# Patient Record
Sex: Male | Born: 1970 | Race: White | Hispanic: No | Marital: Single | Smoking: Current some day smoker
Health system: Southern US, Community
[De-identification: ages and names within clinical notes are randomized; demographics above are authoritative.]

## PROBLEM LIST (undated history)

## (undated) DIAGNOSIS — G43909 Migraine, unspecified, not intractable, without status migrainosus: Secondary | ICD-10-CM

## (undated) DIAGNOSIS — K219 Gastro-esophageal reflux disease without esophagitis: Secondary | ICD-10-CM

## (undated) DIAGNOSIS — G4730 Sleep apnea, unspecified: Secondary | ICD-10-CM

## (undated) DIAGNOSIS — F419 Anxiety disorder, unspecified: Secondary | ICD-10-CM

## (undated) DIAGNOSIS — E785 Hyperlipidemia, unspecified: Secondary | ICD-10-CM

## (undated) DIAGNOSIS — C8590 Non-Hodgkin lymphoma, unspecified, unspecified site: Secondary | ICD-10-CM

## (undated) DIAGNOSIS — M1990 Unspecified osteoarthritis, unspecified site: Secondary | ICD-10-CM

## (undated) DIAGNOSIS — F32A Depression, unspecified: Secondary | ICD-10-CM

## (undated) DIAGNOSIS — I10 Essential (primary) hypertension: Secondary | ICD-10-CM

## (undated) HISTORY — PX: COLONOSCOPY: SHX174

## (undated) HISTORY — DX: Anxiety disorder, unspecified: F41.9

## (undated) HISTORY — DX: Hyperlipidemia, unspecified: E78.5

## (undated) HISTORY — DX: Sleep apnea, unspecified: G47.30

## (undated) HISTORY — PX: UPPER GASTROINTESTINAL ENDOSCOPY: SHX188

---

## 2020-12-17 ENCOUNTER — Ambulatory Visit
Admission: RE | Admit: 2020-12-17 | Discharge: 2020-12-17 | Disposition: A | Discharge status: Home / Self Care | Payer: Self-pay | Source: Ambulatory Visit | Attending: Radiation Oncology | Admitting: Radiation Oncology

## 2020-12-17 ENCOUNTER — Other Ambulatory Visit: Payer: Self-pay | Admitting: Radiation Oncology

## 2020-12-17 DIAGNOSIS — C884 Extranodal marginal zone B-cell lymphoma of mucosa-associated lymphoid tissue [MALT-lymphoma]: Secondary | ICD-10-CM

## 2020-12-25 ENCOUNTER — Institutional Professional Consult (permissible substitution): Payer: Self-pay | Admitting: Radiation Oncology

## 2020-12-25 ENCOUNTER — Ambulatory Visit: Payer: Self-pay

## 2020-12-28 NOTE — Progress Notes (Signed)
Histology and Location of Primary Skin Cancer:  Recurrent primary cutaneous marginal zone lymphoma   Peri Maris presented with the following signs/symptoms: 2014 was found to have a slowly enlarging nodule on his left upper back that was excised and treated with radiation. In 2016 he experienced recurrence to his left upper chest wall and back, and left anterior arm, which were all excised and treated with radiation.   Biopsies revealed  11/13/2020    05/01/2015   03/20/2015   SAFETY ISSUES:  Prior radiation? Yes  July 2016  Oct. 2014   Pacemaker/ICD? No  Possible current pregnancy? N/A  Is the patient on methotrexate? No  Current Complaints / other details:  Patient has received both Pfizer vaccines, but not a COVID booster

## 2020-12-29 ENCOUNTER — Ambulatory Visit
Admission: RE | Admit: 2020-12-29 | Discharge: 2020-12-29 | Disposition: A | Discharge status: Home / Self Care | Payer: No Typology Code available for payment source | Source: Ambulatory Visit | Attending: Radiation Oncology | Admitting: Radiation Oncology

## 2020-12-29 ENCOUNTER — Other Ambulatory Visit: Payer: Self-pay

## 2020-12-29 ENCOUNTER — Encounter: Payer: Self-pay | Admitting: Radiation Oncology

## 2020-12-29 DIAGNOSIS — Z79899 Other long term (current) drug therapy: Secondary | ICD-10-CM | POA: Diagnosis not present

## 2020-12-29 DIAGNOSIS — Z923 Personal history of irradiation: Secondary | ICD-10-CM | POA: Diagnosis not present

## 2020-12-29 DIAGNOSIS — C884 Extranodal marginal zone B-cell lymphoma of mucosa-associated lymphoid tissue [MALT-lymphoma]: Secondary | ICD-10-CM

## 2020-12-29 DIAGNOSIS — Z87891 Personal history of nicotine dependence: Secondary | ICD-10-CM | POA: Diagnosis not present

## 2020-12-29 NOTE — Progress Notes (Signed)
Radiation Oncology         [phone] ________________________________  Initial Outpatient Consultation  Name: Romano Stigger MRN: [id number]  Date: 12/29/2020  DOB: [birth year]  CC:No primary care provider on file.  [place]  DIAGNOSIS:    ICD-10-CM   1. 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 male with a history of marginal zone lymphoma of multiple cutaneous sites.  He was serving in [employer] in [place] when he was originally diagnosed and treated in [place]. He has also received treatment in [place].  Recently he moved to [place].  His oncologist and primary doctor are through the [facility] in [place]  He presented in 2014 with a slowly enlarging nodule on his left upper back, which was excised and treated with radiation (marginal zone lymphoma).  PET scan on 07/25/2013 showed nonspecific nonenlarged mildly PET avid lymph nodes in the neck and right axilla.  Assessed to be reactive.  I personally reviewed those images.   The patient then experienced a recurrence in 2016 on his left upper chest wall, back, and left anterior arm, all of which were excised and treated with radiation. He is now experiencing an additional recurrence on his left forearm (different area than previous arm lesion).  Of note a bone marrow biopsy on 05/01/2015 was negative.  He denies any history of disease other than cutaneously.  Recent biopsy of an erythematous nodule on the left forearm was performed on 11/12/2020 and revealed marginal zone lymphoma.    PREVIOUS RADIATION THERAPY: Yes at outside medical centers  October 2014: Left upper back - 9 MeV; en face / 0.5 cm bolus; 200 cGy, 3,000 cGy  July 2016: Upper back, lower back,  upper chest, and left upper arm     PAST MEDICAL HISTORY:  has no past medical history on file.    PAST SURGICAL HISTORY:History reviewed. No pertinent surgical history.  FAMILY HISTORY: family history is not on file.  SOCIAL HISTORY:  reports that he quit smoking about 13 months ago. He has never used smokeless tobacco. He reports previous alcohol use. He reports that he does not use drugs.  ALLERGIES: Escitalopram  MEDICATIONS:  Current Outpatient Medications  Medication Sig Dispense Refill  . amLODipine (NORVASC) 10 MG tablet Take 1 tablet by mouth daily.    Marland Kitchen atorvastatin (LIPITOR) 10 MG tablet Take 10 mg by mouth daily.    . celecoxib (CELEBREX) 100 MG capsule Take 1 capsule by mouth daily.    . [employer]S GLUCOSAMINE-CHONDROITIN PO Take 1 capsule by mouth daily.    Marland Kitchen gabapentin (NEURONTIN) 300 MG capsule Take 1 capsule by mouth at bedtime.    . Melatonin 3 MG CAPS Take 2 tablets by mouth at bedtime as needed.    . Multiple Vitamin (MULTIVITAMIN ADULT PO) Take 1 tablet by mouth daily.    . Omega-3 Fatty Acids (FISH OIL) 1000 MG CAPS Take 1 capsule by mouth 2 (two) times daily.    Marland Kitchen omeprazole (PRILOSEC) 20 MG capsule Take 20 mg by mouth daily.    . SUMAtriptan (IMITREX) 50 MG tablet Take 50 mg by mouth daily as needed. No more than 256m per day    . testosterone cypionate (DEPOTESTOSTERONE CYPIONATE) 200 MG/ML injection Inject 200  mg into the muscle See admin instructions. Every 10 days     No current facility-administered medications for this encounter.    REVIEW OF SYSTEMS:  Notable for that above.   PHYSICAL EXAM:  height is 5' 10"  (1.778 m) and weight is 276 lb 12.8 oz (125.6 kg). His temperature is 97.9 F (36.6 C). His blood pressure is 113/71 and his pulse is 70. His respiration is 20 and oxygen saturation is 98%.   General: Alert and oriented, in no acute distress  Lymphatics: No palpable lymphadenopathy in the left antecubital or axillary or supraclavicular  regions Skin: There is a 1 to 2 cm area of subcutaneous nodularity over the left dorsal forearm at the area of his recent biopsy   ECOG 0  0 - Asymptomatic (Fully active, able to carry on all predisease activities without restriction)  1 - Symptomatic but completely ambulatory (Restricted in physically strenuous activity but ambulatory and able to carry out work of a light or sedentary nature. For example, light housework, office work)  2 - Symptomatic, <50% in bed during the day (Ambulatory and capable of all self care but unable to carry out any work activities. Up and about more than 50% of waking hours)  3 - Symptomatic, >50% in bed, but not bedbound (Capable of only limited self-care, confined to bed or chair 50% or more of waking hours)  4 - Bedbound (Completely disabled. Cannot carry on any self-care. Totally confined to bed or chair)  5 - Death   Eustace Pen MM, Creech RH, Tormey DC, et al. [id number]). "Toxicity and response criteria of the [facility]". [place] Oncol. 5 (6): 649-55   LABORATORY DATA:  No results found for: WBC, HGB, HCT, MCV, PLT CMP  No results found for: NA, K, CL, CO2, GLUCOSE, BUN, CREATININE, CALCIUM, PROT, ALBUMIN, AST, ALT, ALKPHOS, BILITOT, GFRNONAA, GFRAA       RADIOGRAPHY: As above   IMPRESSION/PLAN:  Today, I talked to the patient about the findings and work-up thus far. We discussed the patient's diagnosis of MALT lymphoma of the skin and general treatment for this, highlighting the role of radiotherapy in the management. We discussed the available radiation techniques, and focused on the details of logistics and delivery.    We discussed the risks, benefits, and side effects of radiotherapy. Side effects may include but not necessarily be limited to: Skin irritation, fatigue; no guarantees of treatment were given. A consent form was signed and placed in the patient's medical record. The patient was encouraged to ask questions  that I answered to the best of my ability.   We will plan his treatment next week and start his treatment shortly thereafter.  Anticipate delivering 30 Gray in 15 fractions with electron therapy to the left forearm.  We discussed measures to reduce the risk of infection during the COVID-19 pandemic.  He is due for his booster shot.  I strongly recommend that he pursue this.  He has reservations about receiving the booster shot at this time he declines receiving the shot.  He knows to contact us if he changes his mind, we can provide to him here.  I explained that the benefits of the booster far outweigh the risks.  I very much look forward to participating in his care and I thanked him for his service to our country.  On date of service, in total, I spent 35 minutes on this encounter. Patient was seen in person.   __________________________________________  Eppie Gibson, MD  This document serves as a record of services personally performed by Eppie Gibson, MD. It was created on his behalf by Clerance Lav, a trained medical scribe. The creation of this record is based on the scribe's personal observations and the provider's statements to them. This document has been checked and approved by the attending provider.

## 2020-12-30 ENCOUNTER — Encounter: Payer: Self-pay | Admitting: Radiation Oncology

## 2020-12-30 DIAGNOSIS — C884 Extranodal marginal zone B-cell lymphoma of mucosa-associated lymphoid tissue [MALT-lymphoma]: Secondary | ICD-10-CM | POA: Insufficient documentation

## 2021-01-06 ENCOUNTER — Ambulatory Visit
Admission: RE | Admit: 2021-01-06 | Discharge: 2021-01-06 | Disposition: A | Discharge status: Home / Self Care | Payer: No Typology Code available for payment source | Source: Ambulatory Visit | Attending: Radiation Oncology | Admitting: Radiation Oncology

## 2021-01-06 ENCOUNTER — Other Ambulatory Visit: Payer: Self-pay

## 2021-01-06 DIAGNOSIS — C884 Extranodal marginal zone B-cell lymphoma of mucosa-associated lymphoid tissue [MALT-lymphoma]: Secondary | ICD-10-CM | POA: Insufficient documentation

## 2021-01-06 DIAGNOSIS — Z510 Encounter for antineoplastic radiation therapy: Secondary | ICD-10-CM | POA: Diagnosis not present

## 2021-01-08 DIAGNOSIS — Z510 Encounter for antineoplastic radiation therapy: Secondary | ICD-10-CM | POA: Diagnosis not present

## 2021-01-11 ENCOUNTER — Ambulatory Visit
Admission: RE | Admit: 2021-01-11 | Discharge: 2021-01-11 | Disposition: A | Discharge status: Home / Self Care | Payer: No Typology Code available for payment source | Source: Ambulatory Visit | Attending: Radiation Oncology | Admitting: Radiation Oncology

## 2021-01-11 ENCOUNTER — Other Ambulatory Visit: Payer: Self-pay

## 2021-01-11 DIAGNOSIS — Z510 Encounter for antineoplastic radiation therapy: Secondary | ICD-10-CM | POA: Diagnosis not present

## 2021-01-12 ENCOUNTER — Ambulatory Visit
Admission: RE | Admit: 2021-01-12 | Discharge: 2021-01-12 | Disposition: A | Discharge status: Home / Self Care | Payer: No Typology Code available for payment source | Source: Ambulatory Visit | Attending: Radiation Oncology | Admitting: Radiation Oncology

## 2021-01-12 DIAGNOSIS — Z510 Encounter for antineoplastic radiation therapy: Secondary | ICD-10-CM | POA: Diagnosis not present

## 2021-01-13 ENCOUNTER — Other Ambulatory Visit: Payer: Self-pay

## 2021-01-13 ENCOUNTER — Ambulatory Visit
Admission: RE | Admit: 2021-01-13 | Discharge: 2021-01-13 | Disposition: A | Discharge status: Home / Self Care | Payer: No Typology Code available for payment source | Source: Ambulatory Visit | Attending: Radiation Oncology | Admitting: Radiation Oncology

## 2021-01-13 DIAGNOSIS — Z510 Encounter for antineoplastic radiation therapy: Secondary | ICD-10-CM | POA: Diagnosis not present

## 2021-01-14 ENCOUNTER — Ambulatory Visit
Admission: RE | Admit: 2021-01-14 | Discharge: 2021-01-14 | Disposition: A | Discharge status: Home / Self Care | Payer: No Typology Code available for payment source | Source: Ambulatory Visit | Attending: Radiation Oncology | Admitting: Radiation Oncology

## 2021-01-14 DIAGNOSIS — Z510 Encounter for antineoplastic radiation therapy: Secondary | ICD-10-CM | POA: Diagnosis not present

## 2021-01-15 ENCOUNTER — Ambulatory Visit
Admission: RE | Admit: 2021-01-15 | Discharge: 2021-01-15 | Disposition: A | Discharge status: Home / Self Care | Payer: No Typology Code available for payment source | Source: Ambulatory Visit | Attending: Radiation Oncology | Admitting: Radiation Oncology

## 2021-01-15 ENCOUNTER — Other Ambulatory Visit: Payer: Self-pay

## 2021-01-15 DIAGNOSIS — Z510 Encounter for antineoplastic radiation therapy: Secondary | ICD-10-CM | POA: Diagnosis not present

## 2021-01-18 ENCOUNTER — Other Ambulatory Visit: Payer: Self-pay

## 2021-01-18 ENCOUNTER — Ambulatory Visit
Admission: RE | Admit: 2021-01-18 | Discharge: 2021-01-18 | Disposition: A | Discharge status: Home / Self Care | Payer: No Typology Code available for payment source | Source: Ambulatory Visit | Attending: Radiation Oncology | Admitting: Radiation Oncology

## 2021-01-18 DIAGNOSIS — Z510 Encounter for antineoplastic radiation therapy: Secondary | ICD-10-CM | POA: Diagnosis not present

## 2021-01-19 ENCOUNTER — Ambulatory Visit
Admission: RE | Admit: 2021-01-19 | Discharge: 2021-01-19 | Disposition: A | Discharge status: Home / Self Care | Payer: No Typology Code available for payment source | Source: Ambulatory Visit | Attending: Radiation Oncology | Admitting: Radiation Oncology

## 2021-01-19 DIAGNOSIS — Z510 Encounter for antineoplastic radiation therapy: Secondary | ICD-10-CM | POA: Diagnosis not present

## 2021-01-20 ENCOUNTER — Other Ambulatory Visit: Payer: Self-pay

## 2021-01-20 ENCOUNTER — Ambulatory Visit
Admission: RE | Admit: 2021-01-20 | Discharge: 2021-01-20 | Disposition: A | Discharge status: Home / Self Care | Payer: No Typology Code available for payment source | Source: Ambulatory Visit | Attending: Radiation Oncology | Admitting: Radiation Oncology

## 2021-01-20 DIAGNOSIS — Z510 Encounter for antineoplastic radiation therapy: Secondary | ICD-10-CM | POA: Diagnosis not present

## 2021-01-21 ENCOUNTER — Ambulatory Visit
Admission: RE | Admit: 2021-01-21 | Discharge: 2021-01-21 | Disposition: A | Discharge status: Home / Self Care | Payer: No Typology Code available for payment source | Source: Ambulatory Visit | Attending: Radiation Oncology | Admitting: Radiation Oncology

## 2021-01-21 DIAGNOSIS — Z510 Encounter for antineoplastic radiation therapy: Secondary | ICD-10-CM | POA: Diagnosis not present

## 2021-01-22 ENCOUNTER — Ambulatory Visit
Admission: RE | Admit: 2021-01-22 | Discharge: 2021-01-22 | Disposition: A | Discharge status: Home / Self Care | Payer: No Typology Code available for payment source | Source: Ambulatory Visit | Attending: Radiation Oncology | Admitting: Radiation Oncology

## 2021-01-22 ENCOUNTER — Other Ambulatory Visit: Payer: Self-pay

## 2021-01-22 DIAGNOSIS — Z510 Encounter for antineoplastic radiation therapy: Secondary | ICD-10-CM | POA: Diagnosis not present

## 2021-01-25 ENCOUNTER — Ambulatory Visit
Admission: RE | Admit: 2021-01-25 | Discharge: 2021-01-25 | Disposition: A | Discharge status: Home / Self Care | Payer: No Typology Code available for payment source | Source: Ambulatory Visit | Attending: Radiation Oncology | Admitting: Radiation Oncology

## 2021-01-25 ENCOUNTER — Other Ambulatory Visit: Payer: Self-pay

## 2021-01-25 DIAGNOSIS — Z510 Encounter for antineoplastic radiation therapy: Secondary | ICD-10-CM | POA: Diagnosis not present

## 2021-01-26 ENCOUNTER — Ambulatory Visit
Admission: RE | Admit: 2021-01-26 | Discharge: 2021-01-26 | Disposition: A | Discharge status: Home / Self Care | Payer: No Typology Code available for payment source | Source: Ambulatory Visit | Attending: Radiation Oncology | Admitting: Radiation Oncology

## 2021-01-26 DIAGNOSIS — Z510 Encounter for antineoplastic radiation therapy: Secondary | ICD-10-CM | POA: Diagnosis not present

## 2021-01-27 ENCOUNTER — Other Ambulatory Visit: Payer: Self-pay

## 2021-01-27 ENCOUNTER — Ambulatory Visit
Admission: RE | Admit: 2021-01-27 | Discharge: 2021-01-27 | Disposition: A | Discharge status: Home / Self Care | Payer: No Typology Code available for payment source | Source: Ambulatory Visit | Attending: Radiation Oncology | Admitting: Radiation Oncology

## 2021-01-27 DIAGNOSIS — Z510 Encounter for antineoplastic radiation therapy: Secondary | ICD-10-CM | POA: Diagnosis not present

## 2021-01-28 ENCOUNTER — Ambulatory Visit
Admission: RE | Admit: 2021-01-28 | Discharge: 2021-01-28 | Disposition: A | Discharge status: Home / Self Care | Payer: No Typology Code available for payment source | Source: Ambulatory Visit | Attending: Radiation Oncology | Admitting: Radiation Oncology

## 2021-01-28 DIAGNOSIS — Z510 Encounter for antineoplastic radiation therapy: Secondary | ICD-10-CM | POA: Diagnosis not present

## 2021-01-29 ENCOUNTER — Ambulatory Visit
Admission: RE | Admit: 2021-01-29 | Discharge: 2021-01-29 | Disposition: A | Discharge status: Home / Self Care | Payer: No Typology Code available for payment source | Source: Ambulatory Visit | Attending: Radiation Oncology | Admitting: Radiation Oncology

## 2021-01-29 ENCOUNTER — Other Ambulatory Visit: Payer: Self-pay

## 2021-01-29 ENCOUNTER — Encounter: Payer: Self-pay | Admitting: Radiation Oncology

## 2021-01-29 DIAGNOSIS — Z510 Encounter for antineoplastic radiation therapy: Secondary | ICD-10-CM | POA: Diagnosis present

## 2021-01-29 DIAGNOSIS — C884 Extranodal marginal zone B-cell lymphoma of mucosa-associated lymphoid tissue [MALT-lymphoma]: Secondary | ICD-10-CM | POA: Diagnosis present

## 2021-03-02 ENCOUNTER — Ambulatory Visit
Admission: RE | Admit: 2021-03-02 | Discharge: 2021-03-02 | Disposition: A | Discharge status: Home / Self Care | Payer: No Typology Code available for payment source | Source: Ambulatory Visit | Attending: Radiation Oncology | Admitting: Radiation Oncology

## 2021-03-02 ENCOUNTER — Other Ambulatory Visit: Payer: Self-pay

## 2021-03-02 VITALS — BP 126/73 | HR 69 | Temp 97.7°F | Resp 20 | Ht 70.0 in | Wt 269.4 lb

## 2021-03-02 DIAGNOSIS — Z923 Personal history of irradiation: Secondary | ICD-10-CM | POA: Insufficient documentation

## 2021-03-02 DIAGNOSIS — C884 Extranodal marginal zone B-cell lymphoma of mucosa-associated lymphoid tissue [MALT-lymphoma]: Secondary | ICD-10-CM | POA: Diagnosis not present

## 2021-03-02 DIAGNOSIS — R5383 Other fatigue: Secondary | ICD-10-CM | POA: Diagnosis not present

## 2021-03-02 DIAGNOSIS — Z79899 Other long term (current) drug therapy: Secondary | ICD-10-CM | POA: Diagnosis not present

## 2021-03-02 NOTE — Progress Notes (Signed)
Mr. Wissner presents today for follow-up after completing radiation to his left forearm on 01/29/2021  Continues to struggle with fatigue throughout the day (despite feeling like he gets adequate sleep at night). Denies any pain or discomfort to treatment area. Skin appears intact and well healed. He continues to apply lotion to area to prevent peeling/flaking. Denies any changes in appetite or issues with nausea. Denies any changes to bowel or bladder habits. Other than lingering fatigue, he reports he feels and is doing well  Wt Readings from Last 3 Encounters:  03/02/21 269 lb 6.4 oz (122.2 kg)  12/29/20 276 lb 12.8 oz (125.6 kg)   Vitals:   03/02/21 1438  BP: 126/73  Pulse: 69  Resp: 20  Temp: 97.7 F (36.5 C)  SpO2: 98%

## 2021-03-03 ENCOUNTER — Encounter: Payer: Self-pay | Admitting: Radiation Oncology

## 2021-03-03 NOTE — Progress Notes (Signed)
Radiation Oncology         [phone] ________________________________  Name: Kent Roberts MRN: [id number]  Date: 03/02/2021  DOB: [birth year]  Follow-Up Visit Note  Outpatient  CC: Albesa Seen, MD  [facility]  Diagnosis and Prior Radiotherapy:    ICD-10-CM   1. 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   Mr. Klippel presents today for follow-up after completing radiation to his left forearm on 01/29/2021  Continues to struggle with fatigue throughout the day (despite feeling like he gets adequate sleep at night). Denies any pain or discomfort to treatment area. Skin appears intact and well healed. He continues to apply lotion to area to prevent peeling/flaking. Denies any changes in appetite or issues with nausea. Denies any changes to bowel or bladder habits. Other than lingering fatigue, he reports he feels and is doing well  Wt Readings from Last 3 Encounters:  03/02/21 269 lb 6.4 oz (122.2 kg)  12/29/20 276 lb 12.8 oz (125.6 kg)   Vitals:   03/02/21 1438  BP: 126/73  Pulse: 69  Resp: 20  Temp: 97.7 F (36.5 C)  SpO2: 98%                                ALLERGIES:  is allergic to escitalopram.  Meds: Current Outpatient Medications  Medication Sig Dispense Refill  . baclofen (LIORESAL) 10 MG tablet Take 1 tablet by mouth at bedtime as needed.    . DULoxetine (CYMBALTA) 20 MG capsule Take 1 capsule by mouth daily.    Marland Kitchen venlafaxine XR (EFFEXOR-XR) 75 MG 24 hr capsule Take 1 capsule by mouth daily.    Marland Kitchen amLODipine (NORVASC) 10 MG tablet Take 1 tablet by mouth daily.    Marland Kitchen atorvastatin (LIPITOR) 10 MG tablet Take 10 mg by mouth daily.    . celecoxib (CELEBREX) 100 MG capsule Take 1 capsule by mouth daily.    . CVS GLUCOSAMINE-CHONDROITIN PO Take 1 capsule by mouth  daily.    Marland Kitchen gabapentin (NEURONTIN) 300 MG capsule Take 1 capsule by mouth at bedtime.    . Melatonin 3 MG CAPS Take 2 tablets by mouth at bedtime as needed.    . Multiple Vitamin (MULTIVITAMIN ADULT PO) Take 1 tablet by mouth daily.    . Omega-3 Fatty Acids (FISH OIL) 1000 MG CAPS Take 1 capsule by mouth 2 (two) times daily.    Marland Kitchen omeprazole (PRILOSEC) 20 MG capsule Take 20 mg by mouth daily.    . SUMAtriptan (IMITREX) 50 MG tablet Take 50 mg by mouth daily as needed. No more than 200mg  per day    . testosterone cypionate (DEPOTESTOSTERONE CYPIONATE) 200 MG/ML injection Inject 200 mg into the muscle See admin instructions. Every 10 days     No current facility-administered medications for this encounter.    Physical Findings: The patient is in no acute distress. Patient is alert and oriented.  height is 5\' 10"  (1.778 m) and weight is 269 lb 6.4 oz (122.2 kg). His temperature is 97.7 F (36.5 C). His blood pressure is 126/73 and his pulse is 69. His respiration is 20 and oxygen saturation is 98%. .    Skin in treatment field of left forearm has no residual changes from RT.  There  is slight nodularity at the biopsy site, subtle and reduced from pre treatment nodularity, c/w scar tissue from biopsy.  Lab Findings: No results found for: WBC, HGB, HCT, MCV, PLT  Radiographic Findings: No results found.  Impression/Plan:   He is doing well postradiation therapy.  The slight nodularity is consistent with biopsy changes.  The raised lesion has regressed.  I told him that his fatigue should continue to lift over the next several weeks.  He is going to be following with dermatology and medical oncology.  I will see him back on his on an as-needed basis.  He is pleased with this plan and knows to call us if there is any issues in the future that we can help with.   On date of service, in total, I spent 10 minutes on this encounter. Patient was seen in person.   _____________________________________   Eppie Gibson, MD

## 2021-03-24 NOTE — Progress Notes (Signed)
  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   Plan: The patient will follow-up with radiation oncology in 95mo.  -----------------------------------  Eppie Gibson, MD

## 2021-08-04 ENCOUNTER — Encounter (HOSPITAL_BASED_OUTPATIENT_CLINIC_OR_DEPARTMENT_OTHER): Payer: Self-pay

## 2021-08-04 ENCOUNTER — Emergency Department (HOSPITAL_BASED_OUTPATIENT_CLINIC_OR_DEPARTMENT_OTHER): Payer: No Typology Code available for payment source

## 2021-08-04 ENCOUNTER — Other Ambulatory Visit: Payer: Self-pay

## 2021-08-04 ENCOUNTER — Emergency Department (HOSPITAL_BASED_OUTPATIENT_CLINIC_OR_DEPARTMENT_OTHER)
Admission: EM | Admit: 2021-08-04 | Discharge: 2021-08-04 | Disposition: A | Discharge status: Home / Self Care | Payer: No Typology Code available for payment source | Attending: Emergency Medicine | Admitting: Emergency Medicine

## 2021-08-04 ENCOUNTER — Other Ambulatory Visit (HOSPITAL_BASED_OUTPATIENT_CLINIC_OR_DEPARTMENT_OTHER): Payer: Self-pay

## 2021-08-04 DIAGNOSIS — R519 Headache, unspecified: Secondary | ICD-10-CM | POA: Diagnosis not present

## 2021-08-04 DIAGNOSIS — F17210 Nicotine dependence, cigarettes, uncomplicated: Secondary | ICD-10-CM | POA: Diagnosis not present

## 2021-08-04 DIAGNOSIS — Z79899 Other long term (current) drug therapy: Secondary | ICD-10-CM | POA: Diagnosis not present

## 2021-08-04 DIAGNOSIS — R42 Dizziness and giddiness: Secondary | ICD-10-CM | POA: Insufficient documentation

## 2021-08-04 DIAGNOSIS — I10 Essential (primary) hypertension: Secondary | ICD-10-CM | POA: Insufficient documentation

## 2021-08-04 HISTORY — DX: Migraine, unspecified, not intractable, without status migrainosus: G43.909

## 2021-08-04 HISTORY — DX: Essential (primary) hypertension: I10

## 2021-08-04 HISTORY — DX: Non-Hodgkin lymphoma, unspecified, unspecified site: C85.90

## 2021-08-04 HISTORY — DX: Gastro-esophageal reflux disease without esophagitis: K21.9

## 2021-08-04 HISTORY — DX: Unspecified osteoarthritis, unspecified site: M19.90

## 2021-08-04 HISTORY — DX: Depression, unspecified: F32.A

## 2021-08-04 LAB — CBC WITH DIFFERENTIAL/PLATELET
Abs Immature Granulocytes: 0.01 10*3/uL (ref 0.00–0.07)
Basophils Absolute: 0 10*3/uL (ref 0.0–0.1)
Basophils Relative: 1 %
Eosinophils Absolute: 0.3 10*3/uL (ref 0.0–0.5)
Eosinophils Relative: 6 %
HCT: 48.3 % (ref 39.0–52.0)
Hemoglobin: 16.2 g/dL (ref 13.0–17.0)
Immature Granulocytes: 0 %
Lymphocytes Relative: 30 %
Lymphs Abs: 1.7 10*3/uL (ref 0.7–4.0)
MCH: 31.6 pg (ref 26.0–34.0)
MCHC: 33.5 g/dL (ref 30.0–36.0)
MCV: 94.3 fL (ref 80.0–100.0)
Monocytes Absolute: 0.6 10*3/uL (ref 0.1–1.0)
Monocytes Relative: 12 %
Neutro Abs: 2.9 10*3/uL (ref 1.7–7.7)
Neutrophils Relative %: 51 %
Platelets: 165 10*3/uL (ref 150–400)
RBC: 5.12 MIL/uL (ref 4.22–5.81)
RDW: 14.7 % (ref 11.5–15.5)
WBC: 5.6 10*3/uL (ref 4.0–10.5)
nRBC: 0 % (ref 0.0–0.2)

## 2021-08-04 LAB — BASIC METABOLIC PANEL
Anion gap: 6 (ref 5–15)
BUN: 13 mg/dL (ref 6–20)
CO2: 28 mmol/L (ref 22–32)
Calcium: 8.5 mg/dL — ABNORMAL LOW (ref 8.9–10.3)
Chloride: 104 mmol/L (ref 98–111)
Creatinine, Ser: 1.29 mg/dL — ABNORMAL HIGH (ref 0.61–1.24)
GFR, Estimated: >60 mL/min (ref >60)
Glucose, Bld: 92 mg/dL (ref 70–99)
Potassium: 3.5 mmol/L (ref 3.5–5.1)
Sodium: 138 mmol/L (ref 135–145)

## 2021-08-04 MED ORDER — DIPHENHYDRAMINE HCL 50 MG/ML IJ SOLN
25.0000 mg | Freq: Once | INTRAMUSCULAR | Status: AC
  Administered 2021-08-04: 25 mg via INTRAVENOUS
  Filled 2021-08-04: qty 1

## 2021-08-04 MED ORDER — PROCHLORPERAZINE EDISYLATE 10 MG/2ML IJ SOLN
10.0000 mg | Freq: Once | INTRAMUSCULAR | Status: AC
  Administered 2021-08-04: 10 mg via INTRAVENOUS
  Filled 2021-08-04: qty 2

## 2021-08-04 MED ORDER — IOHEXOL 350 MG/ML SOLN
100.0000 mL | Freq: Once | INTRAVENOUS | Status: AC | PRN
  Administered 2021-08-04: 100 mL via INTRAVENOUS

## 2021-08-04 MED ORDER — SODIUM CHLORIDE 0.9 % IV BOLUS
1000.0000 mL | Freq: Once | INTRAVENOUS | Status: AC
  Administered 2021-08-04: 1000 mL via INTRAVENOUS

## 2021-08-04 MED ORDER — MECLIZINE HCL 25 MG PO TABS
25.0000 mg | ORAL_TABLET | Freq: Three times a day (TID) | ORAL | 0 refills | Status: DC | PRN
  Filled 2021-08-04: qty 30, 10d supply, fill #0

## 2021-08-04 MED ORDER — DEXAMETHASONE SODIUM PHOSPHATE 10 MG/ML IJ SOLN
10.0000 mg | Freq: Once | INTRAMUSCULAR | Status: AC
  Administered 2021-08-04: 10 mg via INTRAVENOUS
  Filled 2021-08-04: qty 1

## 2021-08-04 NOTE — ED Notes (Signed)
Pt requested to go to bathroom, urinal provided for patient convenience.

## 2021-08-04 NOTE — ED Provider Notes (Signed)
[facility] Provider Note   CSN: [id number] Arrival date & time: 08/04/21  1229     History Chief Complaint  Patient presents with   Headache   Dizziness    Kent Roberts is a 50 y.o. male.  Kent Roberts is a 50 y.o. male with prior medical history significant for migraines, HTN, and GERD that presents today for evaluation of dizziness. Patient states he was working out in the yard last Wednesday when he had a sudden onset of dizziness where everything felt like it was moving around him and he felt off-balance. Laying down makes the dizziness less severe, but the dizziness has never completely resolved. Standing and turning his head makes the dizziness worse. It is associated with a headache that he describes as a tight band around the front of his head. He has tried tylenol and his migraine medication with no relief. He does have a history of migraines but relays that his migraines are generally behind his left eye and this is different. This is not the worst headache of his life. He has also tried to stay hydrated with gatorade and fluids. Denies changes to vision, numbness or weakness. Denies neck pain. No changes to hearing, shortness of breath, or chest pain. Has experienced dizziness in the past but usually resolved on its own. Of note, he also reports he believes he had a stomach virus the last two days where he had multiple episodes of nausea and vomiting. That has since resolved.  The history is provided by the patient and medical records.      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 No pertinent surgical history.     No family history on  file.  Social History   Tobacco Use   Smoking status: Some Days    Types: Cigarettes, Cigars   Smokeless tobacco: Never  Vaping Use   Vaping Use: Never used  Substance Use Topics   Alcohol use: Yes    Comment: occasionally    Drug use: Never    Home Medications Prior to Admission medications   Medication Sig Start Date End Date Taking? Authorizing Provider  meclizine (ANTIVERT) 25 MG tablet Take 1 tablet (25 mg total) by mouth 3 (three) times daily as needed for dizziness. 08/04/21  Yes Jacqlyn Larsen, PA-C  amLODipine (NORVASC) 10 MG tablet Take 1 tablet by mouth daily. 10/08/20   [provider]  atorvastatin (LIPITOR) 10 MG tablet Take 10 mg by mouth daily. 10/08/20   [provider]  baclofen (LIORESAL) 10 MG tablet Take 1 tablet by mouth at bedtime as needed. 02/02/21   [provider]  celecoxib (CELEBREX) 100 MG capsule Take 1 capsule by mouth daily. 10/08/20   [provider]  [facility] GLUCOSAMINE-CHONDROITIN PO Take 1 capsule by mouth daily. 08/14/17   [provider]  DULoxetine (CYMBALTA) 20 MG capsule Take 1 capsule by mouth daily. 02/06/21   [provider]  gabapentin (NEURONTIN) 300 MG capsule Take 1 capsule by mouth at bedtime. 10/08/20   [provider]  Melatonin 3 MG CAPS Take 2 tablets by mouth at bedtime as needed. 10/08/20  [provider]  Multiple Vitamin (MULTIVITAMIN ADULT PO) Take 1 tablet by mouth daily. 10/08/20   [provider]  Omega-3 Fatty Acids (FISH OIL) 1000 MG CAPS Take 1 capsule by mouth 2 (two) times daily. 10/08/20   [provider]  omeprazole (PRILOSEC) 20 MG capsule Take 20 mg by mouth daily. 10/08/20   [provider]  SUMAtriptan (IMITREX) 50 MG tablet Take 50 mg by mouth daily as needed. No more than 200mg  per day 10/08/20   [provider]  testosterone cypionate (DEPOTESTOSTERONE CYPIONATE) 200 MG/ML injection Inject 200 mg into the muscle See  admin instructions. Every 10 days 10/08/20   [provider]  venlafaxine XR (EFFEXOR-XR) 75 MG 24 hr capsule Take 1 capsule by mouth daily. 01/28/21   [provider]    Allergies    Bee venom  Review of Systems   Review of Systems  Constitutional:  Negative for chills and fever.  HENT: Negative.    Eyes:  Negative for visual disturbance.  Respiratory:  Negative for shortness of breath.   Cardiovascular:  Negative for chest pain and palpitations.  Gastrointestinal:  Positive for nausea and vomiting. Negative for abdominal pain.  Genitourinary:  Negative for dysuria and frequency.  Musculoskeletal:  Negative for arthralgias and myalgias.  Skin:  Negative for color change and rash.  Neurological:  Positive for dizziness and headaches. Negative for tremors, seizures, facial asymmetry, speech difficulty, weakness, light-headedness and numbness.  All other systems reviewed and are negative.  Physical Exam Updated Vital Signs BP 120/87 (BP Location: Left Arm)   Pulse 93   Temp 98.3 F (36.8 C)   Resp 20   Ht 5\' 10"  (1.778 m)   Wt 122.9 kg   SpO2 99%   BMI 38.88 kg/m   Physical Exam Vitals and nursing note reviewed.  Constitutional:      General: He is not in acute distress.    Appearance: Normal appearance. He is well-developed. He is not ill-appearing or diaphoretic.  HENT:     Head: Normocephalic and atraumatic.     Mouth/Throat:     Mouth: Mucous membranes are moist.     Pharynx: Oropharynx is clear.  Eyes:     General:        Right eye: No discharge.        Left eye: No discharge.     Extraocular Movements: Extraocular movements intact.     Pupils: Pupils are equal, round, and reactive to light.     Comments: No nystagmus but patient did experience some dizziness with extraocular movements.  Cardiovascular:     Rate and Rhythm: Normal rate and regular rhythm.     Pulses: Normal pulses.     Heart sounds: Normal heart sounds.  Pulmonary:      Effort: Pulmonary effort is normal. No respiratory distress.     Breath sounds: Normal breath sounds. No wheezing or rales.     Comments: Respirations equal and unlabored, patient able to speak in full sentences, lungs clear to auscultation bilaterally  Abdominal:     General: Bowel sounds are normal. There is no distension.     Palpations: Abdomen is soft. There is no mass.     Tenderness: There is no abdominal tenderness. There is no guarding.     Comments: Abdomen soft, nondistended, nontender to palpation in all quadrants without guarding or peritoneal signs  Musculoskeletal:        General: No deformity.     Cervical back:  Neck supple. No rigidity.  Skin:    General: Skin is warm and dry.     Capillary Refill: Capillary refill takes less than 2 seconds.  Neurological:     Mental Status: He is alert and oriented to person, place, and time.     GCS: GCS eye subscore is 4. GCS verbal subscore is 5. GCS motor subscore is 6.     Coordination: Coordination normal.     Comments: Speech is clear, able to follow commands CN III-XII intact Normal strength in upper and lower extremities bilaterally including dorsiflexion and plantar flexion, strong and equal grip strength Sensation normal to light and sharp touch Moves extremities without ataxia, coordination intact Normal finger to nose and rapid alternating movements No pronator drift  Psychiatric:        Mood and Affect: Mood normal.        Behavior: Behavior normal.    ED Results / Procedures / Treatments   Labs (all labs ordered are listed, but only abnormal results are displayed) Labs Reviewed  BASIC METABOLIC PANEL - Abnormal; Notable for the following components:      Result Value   Creatinine, Ser 1.29 (*)    Calcium 8.5 (*)    All other components within normal limits  CBC WITH DIFFERENTIAL/PLATELET    EKG None  Radiology CT Angio Head W or Wo Contrast  Result Date: 08/04/2021 CLINICAL DATA:  Stroke.  TIA.   Dizziness and nausea. EXAM: CT ANGIOGRAPHY HEAD AND NECK TECHNIQUE: Multidetector CT imaging of the head and neck was performed using the standard protocol during bolus administration of intravenous contrast. Multiplanar CT image reconstructions and MIPs were obtained to evaluate the vascular anatomy. Carotid stenosis measurements (when applicable) are obtained utilizing NASCET criteria, using the distal internal carotid diameter as the denominator. CONTRAST:  15mL OMNIPAQUE IOHEXOL 350 MG/ML SOLN COMPARISON:  None. FINDINGS: CT HEAD FINDINGS Brain: The brain shows a normal appearance without evidence of malformation, atrophy, old or acute small or large vessel infarction, mass lesion, hemorrhage, hydrocephalus or extra-axial collection. Vascular: No hyperdense vessel. No evidence of atherosclerotic calcification. Skull: Normal.  No traumatic finding.  No focal bone lesion. Sinuses/Orbits: Sinuses are clear. Orbits appear normal. Mastoids are clear. Other: None significant CTA NECK FINDINGS Aortic arch: Normal Right carotid system: Common carotid artery widely patent to the bifurcation. Carotid bifurcation is normal. Cervical ICA is normal. Left carotid system: Similarly normal. Vertebral arteries: Vertebral artery origins are patent. Both vertebral arteries appear normal through the cervical region to the foramen magnum. Skeleton: Mild midcervical spondylosis. Other neck: No mass or lymphadenopathy. Upper chest: Lung apices are clear. Review of the MIP images confirms the above findings CTA HEAD FINDINGS Anterior circulation: Both internal carotid arteries widely patent through the skull base and siphon regions. The anterior and middle cerebral vessels are normal. No large vessel occlusion. No proximal stenosis. No aneurysm or vascular malformation. Posterior circulation: Both vertebral arteries widely patent to the basilar. No basilar stenosis. Posterior circulation branch vessels are normal. Venous sinuses:  Patent and normal. Anatomic variants: None significant. Review of the MIP images confirms the above findings IMPRESSION: Normal head CT. Normal CT angiography of the head and neck. Electronically Signed   By: Nelson Chimes M.D.   On: 08/04/2021 15:46   CT Angio Neck W and/or Wo Contrast  Result Date: 08/04/2021 CLINICAL DATA:  Stroke.  TIA.  Dizziness and nausea. EXAM: CT ANGIOGRAPHY HEAD AND NECK TECHNIQUE: Multidetector CT imaging of the head and neck  was performed using the standard protocol during bolus administration of intravenous contrast. Multiplanar CT image reconstructions and MIPs were obtained to evaluate the vascular anatomy. Carotid stenosis measurements (when applicable) are obtained utilizing NASCET criteria, using the distal internal carotid diameter as the denominator. CONTRAST:  139mL OMNIPAQUE IOHEXOL 350 MG/ML SOLN COMPARISON:  None. FINDINGS: CT HEAD FINDINGS Brain: The brain shows a normal appearance without evidence of malformation, atrophy, old or acute small or large vessel infarction, mass lesion, hemorrhage, hydrocephalus or extra-axial collection. Vascular: No hyperdense vessel. No evidence of atherosclerotic calcification. Skull: Normal.  No traumatic finding.  No focal bone lesion. Sinuses/Orbits: Sinuses are clear. Orbits appear normal. Mastoids are clear. Other: None significant CTA NECK FINDINGS Aortic arch: Normal Right carotid system: Common carotid artery widely patent to the bifurcation. Carotid bifurcation is normal. Cervical ICA is normal. Left carotid system: Similarly normal. Vertebral arteries: Vertebral artery origins are patent. Both vertebral arteries appear normal through the cervical region to the foramen magnum. Skeleton: Mild midcervical spondylosis. Other neck: No mass or lymphadenopathy. Upper chest: Lung apices are clear. Review of the MIP images confirms the above findings CTA HEAD FINDINGS Anterior circulation: Both internal carotid arteries widely patent  through the skull base and siphon regions. The anterior and middle cerebral vessels are normal. No large vessel occlusion. No proximal stenosis. No aneurysm or vascular malformation. Posterior circulation: Both vertebral arteries widely patent to the basilar. No basilar stenosis. Posterior circulation branch vessels are normal. Venous sinuses: Patent and normal. Anatomic variants: None significant. Review of the MIP images confirms the above findings IMPRESSION: Normal head CT. Normal CT angiography of the head and neck. Electronically Signed   By: Nelson Chimes M.D.   On: 08/04/2021 15:46    Procedures Procedures   Medications Ordered in ED Medications  sodium chloride 0.9 % bolus 1,000 mL (0 mLs Intravenous Stopped 08/04/21 1710)  prochlorperazine (COMPAZINE) injection 10 mg (10 mg Intravenous Given 08/04/21 1412)  diphenhydrAMINE (BENADRYL) injection 25 mg (25 mg Intravenous Given 08/04/21 1411)  dexamethasone (DECADRON) injection 10 mg (10 mg Intravenous Given 08/04/21 1412)  iohexol (OMNIPAQUE) 350 MG/ML injection 100 mL (100 mLs Intravenous Contrast Given 08/04/21 1509)    ED Course  I have reviewed the triage vital signs and the nursing notes.  Pertinent labs & imaging results that were available during my care of the patient were reviewed by me and considered in my medical decision making (see chart for details).    MDM Rules/Calculators/A&P                           50 year old male presents with 1 week of persistent headache dizziness.  Did have 2 days of nausea vomiting that resolved and he felt it was more so related to a GI bug as his wife had similar vomiting but none of the other symptoms.  He has a history of migraines but this headache feels different than his usual headache.  He tried his home migraine medications without relief.  Dizziness improves when he sits still but does not completely resolve.  On neuro exam he has no focal deficits and no cerebellar signs.  Differential  includes peripheral vertigo versus central etiology such as stroke, space-occupying lesion, could also be complex migraine.  Will evaluate with labs and CT angio of the head and neck and give IV headache cocktail.  I have independently ordered, reviewed and interpreted all labs and imaging:  CBC: No leukocytosis, normal hemoglobin  BMP: Creatinine 1.29, no prior available for comparison, no other significant, given and will have patient discuss with PCP at follow-up.  CTs of the head and neck are overall unremarkable with no acute intracranial or vascular abnormalities.  On reevaluation after IV headache cocktail patient is feeling much better, headache has resolved and he is no longer feeling dizzy.  Patient ambulated in the department and reports he had minimal if any dizziness.  I discussed case and reassuring work-up with Dr. Laverta Baltimore, given symptoms have been present for a week and resolved with IV headache cocktail does not feel that patient needs emergent transfer for MRI, lower suspicion for stroke.  May more so be related to complex migraine versus peripheral vertigo.  Will treat with meclizine and Epley maneuvers and have patient follow-up with neurology.  He expresses understanding and agreement with plan.  Strict return precautions discussed.  Patient discharged home in good condition.   Final Clinical Impression(s) / ED Diagnoses Final diagnoses:  Vertigo  Frontal headache    Rx / DC Orders ED Discharge Orders          Ordered    meclizine (ANTIVERT) 25 MG tablet  3 times daily PRN        08/04/21 1707             Jacqlyn Larsen, PA-C 08/04/21 1951    Margette Fast, MD 08/09/21 [phone]

## 2021-08-04 NOTE — ED Triage Notes (Signed)
Pt c/o HA, dizziness, n/v-sx started 1 week ago-NAD-unsteady gait

## 2021-08-04 NOTE — Discharge Instructions (Addendum)
I am very glad your symptoms have resolved.  Your work-up today has been reassuring.  CT scan does not show any masses or vascular abnormalities in your head or neck pain your lab work looks good overall today.  Your creatinine which measures her renal function today was 1.29 please ask your primary care provider to compare this to prior lab results.  To treat dizziness use prescribed meclizine as needed and do Epley maneuvers regularly for the next several days.  You may need to follow-up with neurology if you continue having headaches with associated dizziness or dizziness is not resolving.  Please follow-up with your primary care doctor as well.  If you have return of dizziness and it is not improving or other new or concerning symptoms please return to the ED for reevaluation.
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
## 2021-09-29 ENCOUNTER — Encounter: Payer: Self-pay | Admitting: Psychiatry

## 2021-09-29 ENCOUNTER — Ambulatory Visit (INDEPENDENT_AMBULATORY_CARE_PROVIDER_SITE_OTHER): Payer: Medicare Other | Admitting: Psychiatry

## 2021-09-29 ENCOUNTER — Other Ambulatory Visit: Payer: Self-pay

## 2021-09-29 VITALS — BP 122/76 | HR 80 | Ht 70.0 in | Wt 277.0 lb

## 2021-09-29 DIAGNOSIS — G43009 Migraine without aura, not intractable, without status migrainosus: Secondary | ICD-10-CM

## 2021-09-29 DIAGNOSIS — R42 Dizziness and giddiness: Secondary | ICD-10-CM

## 2021-09-29 NOTE — Patient Instructions (Signed)
MRI brain 2.   Try sumatriptan at the onset of next bad headache or severe vertigo 3.   Let me know if headaches or vertigo worsen and we can discuss medications/physical therapy

## 2021-10-05 ENCOUNTER — Telehealth: Payer: Self-pay | Admitting: Psychiatry

## 2021-10-05 NOTE — Telephone Encounter (Signed)
Medicare/[place]/[employer] order sent to GI, they will reach out to the patient to schedule.

## 2021-11-03 ENCOUNTER — Ambulatory Visit
Admission: RE | Admit: 2021-11-03 | Discharge: 2021-11-03 | Disposition: A | Discharge status: Home / Self Care | Payer: Medicare Other | Source: Ambulatory Visit | Attending: Psychiatry | Admitting: Psychiatry

## 2021-11-03 ENCOUNTER — Other Ambulatory Visit: Payer: Self-pay

## 2021-11-03 DIAGNOSIS — R42 Dizziness and giddiness: Secondary | ICD-10-CM

## 2021-11-03 MED ORDER — GADOBENATE DIMEGLUMINE 529 MG/ML IV SOLN
20.0000 mL | Freq: Once | INTRAVENOUS | Status: AC | PRN
  Administered 2021-11-03: 20 mL via INTRAVENOUS

## 2022-03-07 IMAGING — CT CT ANGIO HEAD
1 of 11 series · 5 of 33 positions shown · IV contrast (omnipaque)
Comparison: None.

CLINICAL DATA: Stroke.  TIA.  Dizziness and nausea.

EXAM:
CT ANGIOGRAPHY HEAD AND NECK
TECHNIQUE: Multidetector CT imaging of the head and neck was performed using
the standard protocol during bolus administration of intravenous
contrast. Multiplanar CT image reconstructions and MIPs were
obtained to evaluate the vascular anatomy. Carotid stenosis
measurements (when applicable) are obtained utilizing NASCET
criteria, using the distal internal carotid diameter as the
denominator.
CONTRAST:  100mL OMNIPAQUE IOHEXOL 350 MG/ML SOLN

[Series 10: axial thin · axial · 0.40mm/px · z∈[-313,-81]mm · 5 of 349 slices shown]
[im 59/349  soft-tissue]
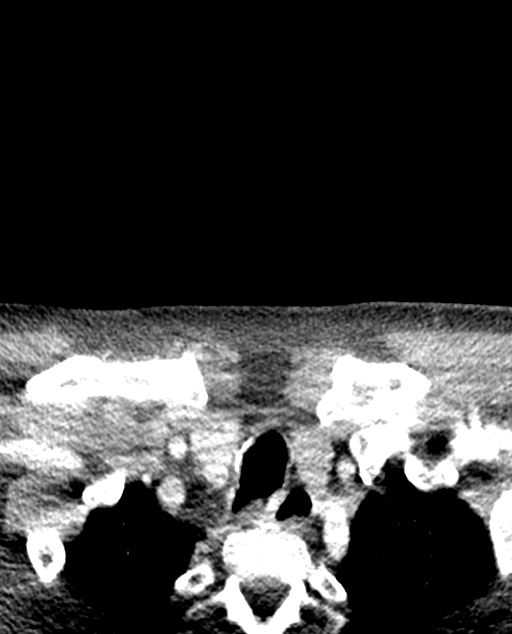
[im 117/349  bone]
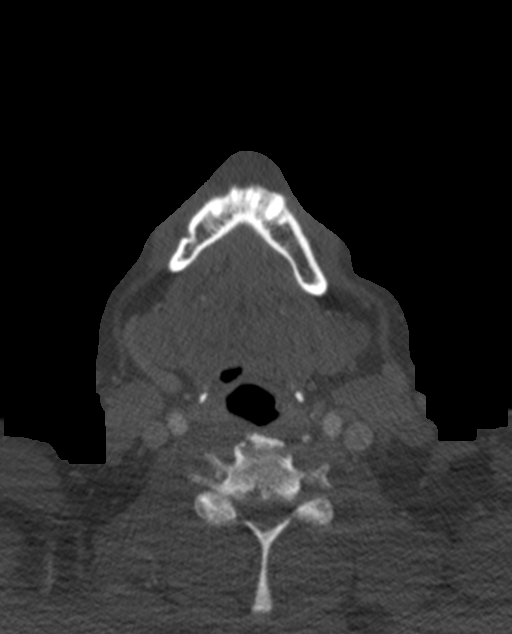
[im 175/349  soft-tissue]
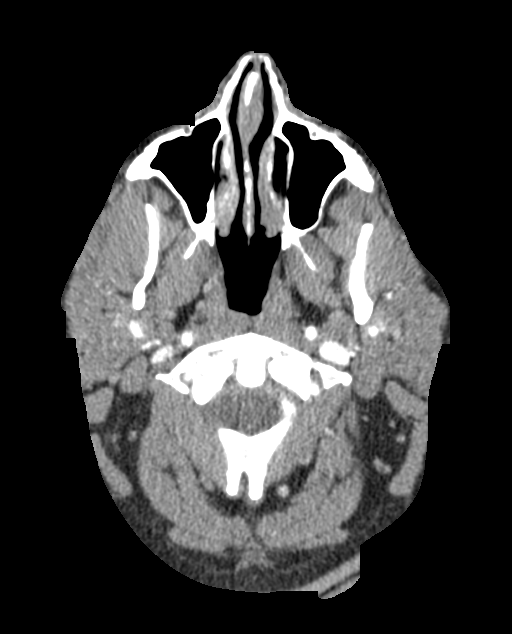
[im 233/349  bone]
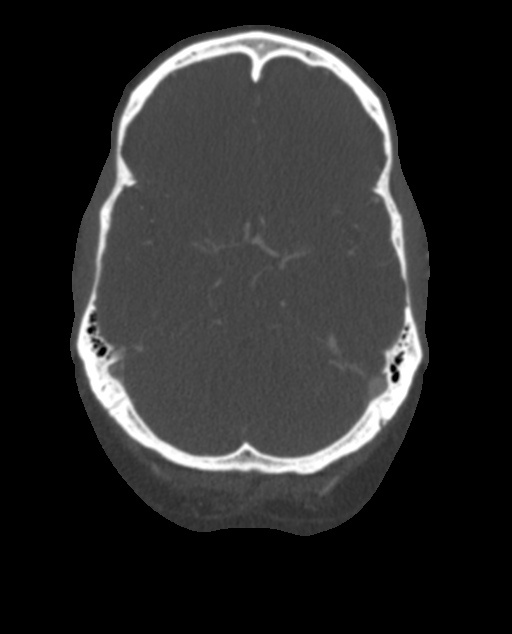
[im 291/349  soft-tissue]
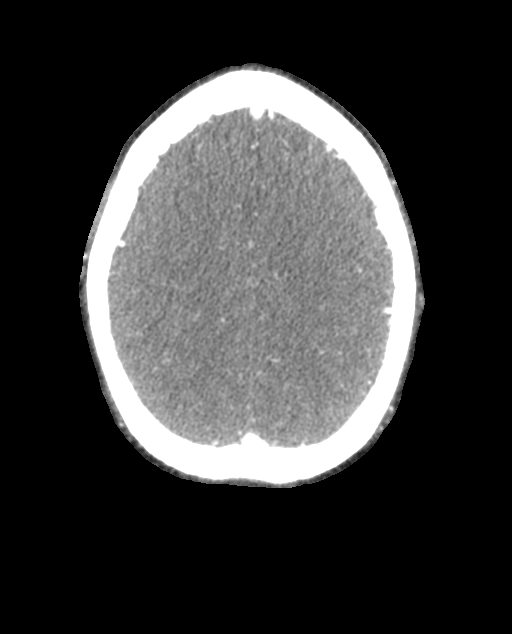

[5 of 33 positions shown; findings below may reference images not displayed]

FINDINGS: CT HEAD FINDINGS

Brain: The brain shows a normal appearance without evidence of
malformation, atrophy, old or acute small or large vessel
infarction, mass lesion, hemorrhage, hydrocephalus or extra-axial
collection.

Vascular: No hyperdense vessel. No evidence of atherosclerotic
calcification.

Skull: Normal.  No traumatic finding.  No focal bone lesion.

Sinuses/Orbits: Sinuses are clear. Orbits appear normal. Mastoids
are clear.

Other: None significant

CTA NECK FINDINGS

Aortic arch: Normal

Right carotid system: Common carotid artery widely patent to the
bifurcation. Carotid bifurcation is normal. Cervical ICA is normal.

Left carotid system: Similarly normal.

Vertebral arteries: Vertebral artery origins are patent. Both
vertebral arteries appear normal through the cervical region to the
foramen magnum.

Skeleton: Mild midcervical spondylosis.

Other neck: No mass or lymphadenopathy.

Upper chest: Lung apices are clear.

Review of the MIP images confirms the above findings

CTA HEAD FINDINGS

Anterior circulation: Both internal carotid arteries widely patent
through the skull base and siphon regions. The anterior and middle
cerebral vessels are normal. No large vessel occlusion. No proximal
stenosis. No aneurysm or vascular malformation.

Posterior circulation: Both vertebral arteries widely patent to the
basilar. No basilar stenosis. Posterior circulation branch vessels
are normal.

Venous sinuses: Patent and normal.

Anatomic variants: None significant.

Review of the MIP images confirms the above findings
IMPRESSION: Normal head CT.

Normal CT angiography of the head and neck.

## 2022-03-07 IMAGING — CT CT ANGIO NECK
1 of 11 series · 5 of 33 positions shown · IV contrast (omnipaque)
Comparison: None.

CLINICAL DATA: Stroke.  TIA.  Dizziness and nausea.

EXAM:
CT ANGIOGRAPHY HEAD AND NECK
TECHNIQUE: Multidetector CT imaging of the head and neck was performed using
the standard protocol during bolus administration of intravenous
contrast. Multiplanar CT image reconstructions and MIPs were
obtained to evaluate the vascular anatomy. Carotid stenosis
measurements (when applicable) are obtained utilizing NASCET
criteria, using the distal internal carotid diameter as the
denominator.
CONTRAST:  100mL OMNIPAQUE IOHEXOL 350 MG/ML SOLN

[Series 10: axial thin · axial · 0.40mm/px · z∈[-313,-81]mm · 5 of 349 slices shown]
[im 59/349  soft-tissue]
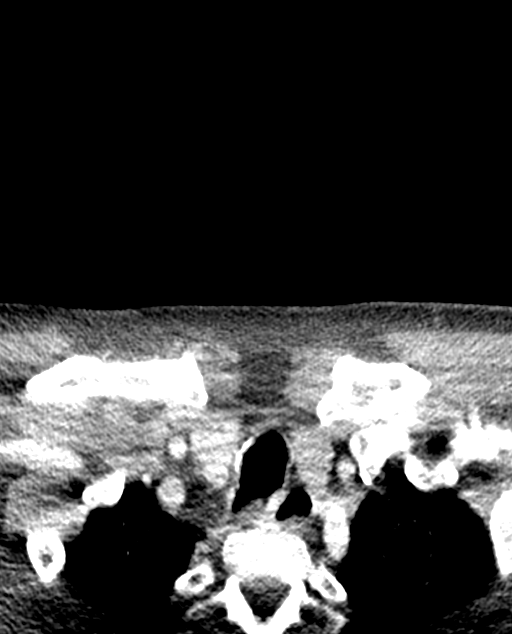
[im 117/349  bone]
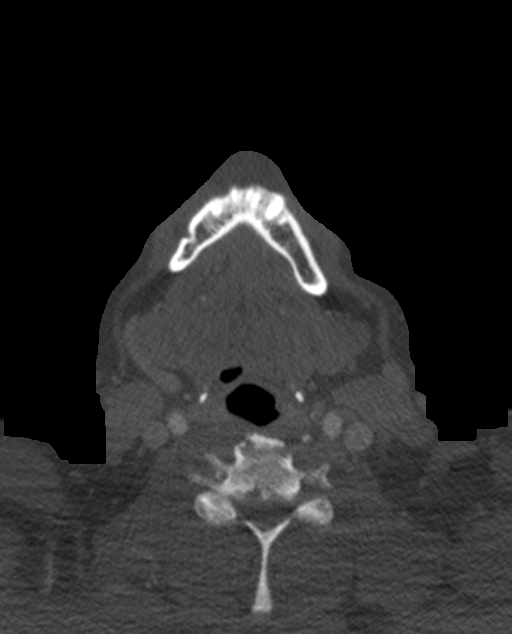
[im 175/349  soft-tissue]
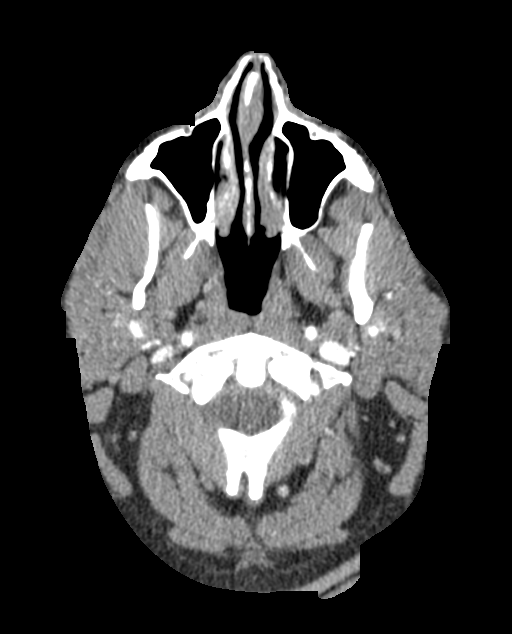
[im 233/349  bone]
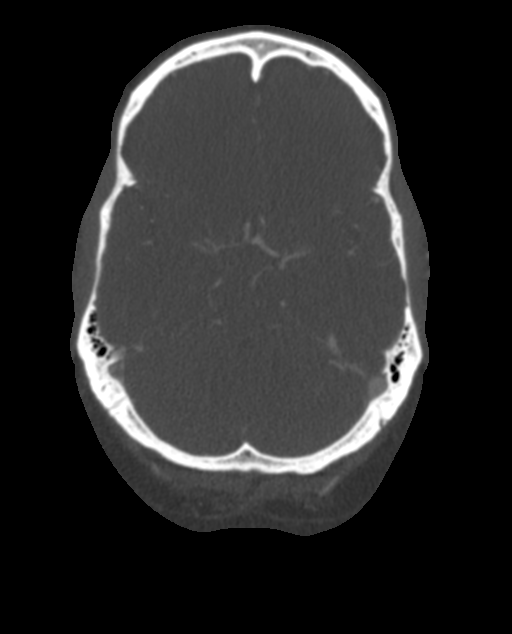
[im 291/349  soft-tissue]
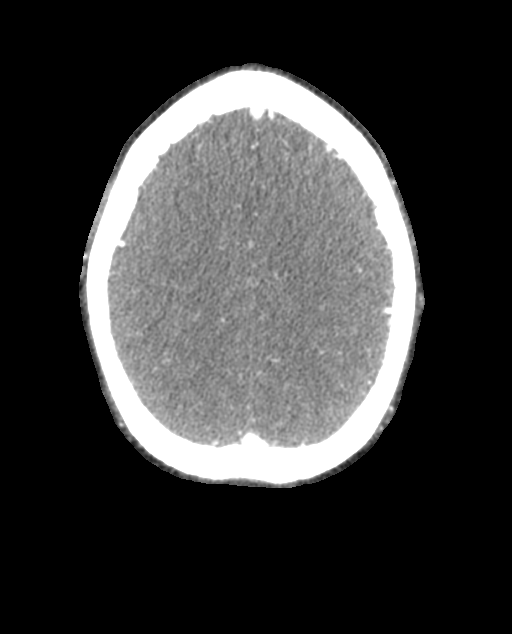

[5 of 33 positions shown; findings below may reference images not displayed]

FINDINGS: CT HEAD FINDINGS

Brain: The brain shows a normal appearance without evidence of
malformation, atrophy, old or acute small or large vessel
infarction, mass lesion, hemorrhage, hydrocephalus or extra-axial
collection.

Vascular: No hyperdense vessel. No evidence of atherosclerotic
calcification.

Skull: Normal.  No traumatic finding.  No focal bone lesion.

Sinuses/Orbits: Sinuses are clear. Orbits appear normal. Mastoids
are clear.

Other: None significant

CTA NECK FINDINGS

Aortic arch: Normal

Right carotid system: Common carotid artery widely patent to the
bifurcation. Carotid bifurcation is normal. Cervical ICA is normal.

Left carotid system: Similarly normal.

Vertebral arteries: Vertebral artery origins are patent. Both
vertebral arteries appear normal through the cervical region to the
foramen magnum.

Skeleton: Mild midcervical spondylosis.

Other neck: No mass or lymphadenopathy.

Upper chest: Lung apices are clear.

Review of the MIP images confirms the above findings

CTA HEAD FINDINGS

Anterior circulation: Both internal carotid arteries widely patent
through the skull base and siphon regions. The anterior and middle
cerebral vessels are normal. No large vessel occlusion. No proximal
stenosis. No aneurysm or vascular malformation.

Posterior circulation: Both vertebral arteries widely patent to the
basilar. No basilar stenosis. Posterior circulation branch vessels
are normal.

Venous sinuses: Patent and normal.

Anatomic variants: None significant.

Review of the MIP images confirms the above findings
IMPRESSION: Normal head CT.

Normal CT angiography of the head and neck.

## 2024-01-22 ENCOUNTER — Encounter: Payer: Self-pay | Admitting: Physician Assistant

## 2024-03-12 NOTE — Progress Notes (Unsigned)
 03/13/2024 Kent Roberts [id number] [birth year]  Referring provider: Mikel Alderman, MD Primary GI doctor: Dr. Karene Oto  ASSESSMENT AND PLAN:  Chronic diarrhea  comes and goes but very rare formed stools Loose stools up to 3 times a day as woken up for bowel movements twice with AB discomfort Can have hematochezia once a month, can have rare ripping rectum pain no fever, chills, weight loss Chronic diarrhea with rare formed stools, occasional nocturnal episodes, and abdominal pain. Occasional blood in stool with urgency. Differential includes infection and microscopic colitis. - Perform stool study for infection for Cdiff - Schedule colonoscopy and endoscopy for microscopic colitis and polyps. - Encourage dietary fiber intake.  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  Patient provides understanding and gave verbal consent to proceed.  Fatty liver Seen on CT abdomen/pelvis 09/2023 with right lobe enlargement no ductal dilation gallbladder contracted well-visualized, normal spleen unremarkable pancreas 09/21/2023 ALT 124, alkaline phosphatase 100, AST 63, albumin 4.9, direct bilirubin 0.1, total bilirubin 0.4 1-2 x a week ETOH use beer 6- 12 pack, intermittent smoking, marijuana - recheck labs with  hepatocellular work up - weight loss and ETOH cessation - check RUQ US    Personal history of colon polyps 02/2020 colonoscopy in [facility] [place]  for hematochezia showed severe diverticulosis extending from sigmoid to ascending colon PCF scope used to bypassed sigmoid, 5 mm cecal polyp, 4 diminutive sigmoid polyps, 2 diminutive rectal polyps next colonoscopy should be done with MAC, all polyps were tubular adenomatous polyps but polyps in the rectum  Non-Hodgkin's lymphoma  Morbid obesity  Body mass index is 46.66 kg/m.  -Patient has been advised to make an attempt to improve diet and exercise patterns to aid in weight loss. -Recommended diet heavy in fruits and veggies and low in animal meats, cheeses, and dairy products, appropriate calorie intake   Patient Care Team: Mikel Alderman, MD as PCP - General (Cardiology)  HISTORY OF PRESENT ILLNESS: 53 y.o. male with a past medical history listed below presents as a new patient from [facility] for community care for evaluation of change in bowel habits, fatty liver and discuss colonoscopy.  Discussed the use of AI scribe software for clinical note transcription with the patient, who gave verbal consent to proceed.  History of Present Illness   Kent Roberts is a 53 year old male with diverticulosis and fatty liver who presents with chronic diarrhea and abdominal pain.  He has experienced chronic diarrhea for nearly a year, characterized by intermittent episodes and rarely having formed stools. Bowel movements occur up to three times daily, with occasional urgency and rare nocturnal episodes associated with abdominal pain. He occasionally notices blood in the stool, approximately once a month, describing the water as 'really pink.' No constipation, fever, or chills.  He has a history of diverticulosis, identified during a colonoscopy in [place], [place] , initially performed due to bleeding.  The colonoscopy revealed extensive diverticulosis  from the sigmoid to the ascending colon, and seven tubular adenomatous polyps were removed. He is unsure when he is due for his next colonoscopy but believes he is overdue.  He reports upper abdominal pain, primarily on the right side, which he associates with his liver. Initially severe, the pain has improved over time. He describes it as sometimes feeling like being 'stabbed' and notes it is not consistently associated with food intake. He experiences acid reflux, which is well-controlled with medication, and occasional nausea, attributed to taking medications on an empty stomach.  His past medical history includes fatty liver disease. He consumes alcohol weekly, typically a six-pack of beer, but sometimes up to a twelve-pack in one day. He has a history of non-Hodgkin's lymphoma, currently in remission, with a CT scan showing small lymph nodes in the mesentery.  Socially, he smokes intermittently and uses marijuana for pain management. He acknowledges his diet has been poor over the past year and a half, which he believes may contribute to his symptoms.        He  reports that he has been smoking cigarettes and cigars. He has never used smokeless tobacco. He reports current alcohol use. He reports that he does not use drugs.  RELEVANT GI HISTORY, IMAGING AND LABS: Results   LABS ALT: elevated AST: elevated  RADIOLOGY CT: Stool throughout colon, diverticula, contracted gallbladder not well visualized, fatty liver, small mesenteric lymph nodes (09/2023)  DIAGNOSTIC Colonoscopy: Extensive diverticulosis from sigmoid to ascending colon, seven tubular adenomatous polyps (5mm, 4 diminutive)      CBC    Component Value Date/Time   WBC 5.6 08/04/2021 1411   RBC 5.12 08/04/2021 1411   HGB 16.2 08/04/2021 1411   HCT 48.3 08/04/2021 1411   PLT 165 08/04/2021 1411   MCV 94.3 08/04/2021 1411   MCH 31.6 08/04/2021 1411   MCHC 33.5 08/04/2021 1411   RDW 14.7 08/04/2021 1411   LYMPHSABS 1.7  08/04/2021 1411   MONOABS 0.6 08/04/2021 1411   EOSABS 0.3 08/04/2021 1411   BASOSABS 0.0 08/04/2021 1411   No results for input(s): "HGB" in the last 8760 hours.  CMP     Component Value Date/Time   NA 138 08/04/2021 1411   K 3.5 08/04/2021 1411   CL 104 08/04/2021 1411   CO2 28 08/04/2021 1411   GLUCOSE 92 08/04/2021 1411   BUN 13 08/04/2021 1411   CREATININE 1.29 (H) 08/04/2021 1411   CALCIUM 8.5 (L) 08/04/2021 1411   GFRNONAA >60 08/04/2021 1411       No data to display            Current Medications:   Current Outpatient Medications (Endocrine & Metabolic):    testosterone cypionate (DEPOTESTOSTERONE CYPIONATE) 200 MG/ML injection, Inject 200 mg into the muscle See admin instructions. Every 10 days  Current Outpatient Medications (Cardiovascular):    amLODipine (NORVASC) 10 MG tablet, Take 1 tablet by mouth daily.   Current Outpatient Medications (Analgesics):    celecoxib (CELEBREX) 100 MG capsule, Take 1 capsule by mouth daily.   SUMAtriptan (IMITREX) 50 MG tablet, Take 50 mg by mouth daily as needed. No more than 200mg  per day   Current Outpatient Medications (Other):    [employer] GLUCOSAMINE-CHONDROITIN PO, Take 1 capsule by mouth daily.   DULoxetine (CYMBALTA) 20 MG capsule, Take 60 mg by mouth daily.   gabapentin (NEURONTIN) 300 MG capsule, Take 1 capsule by mouth at bedtime.   Melatonin 3 MG  CAPS, Take 2 tablets by mouth at bedtime as needed.   Multiple Vitamin (MULTIVITAMIN ADULT PO), Take 1 tablet by mouth daily.   Omega-3 Fatty Acids (FISH OIL) 1000 MG CAPS, Take 1 capsule by mouth 2 (two) times daily.   omeprazole (PRILOSEC) 20 MG capsule, Take 20 mg by mouth daily.   PEG-KCl-NaCl-NaSulf-Na Asc-C (PLENVU) 140 g SOLR, Take 1 kit by mouth once for 1 dose.  Medical History:  Past Medical History:  Diagnosis Date   Arthritis    Depression    GERD (gastroesophageal reflux disease)    Hypertension    Lymphoma (HCC)    Migraine    Allergies:   Allergies  Allergen Reactions   Bee Venom      Surgical History:  He  has no past surgical history on file. Family History:  His family history includes High blood pressure in his father; Neuropathy in his father; Pancreatic cancer in his mother.  REVIEW OF SYSTEMS  : All other systems reviewed and negative except where noted in the History of Present Illness.  PHYSICAL EXAM: BP 134/82   Pulse 94   Ht 5\' 10"  (1.778 m)   Wt (!) 325 lb 3.2 oz (147.5 kg)   BMI 46.66 kg/m  Physical Exam   GENERAL APPEARANCE: Well nourished, in no apparent distress. HEENT: No cervical lymphadenopathy, unremarkable thyroid, sclerae anicteric, conjunctiva pink. RESPIRATORY: Respiratory effort normal, breath sounds clear to auscultation bilaterally without rales, rhonchi, or wheezing. CARDIO: Regular rate and rhythm with no murmurs, rubs, or gallops, peripheral pulses intact. ABDOMEN: Soft, non-distended, active bowel sounds in all four quadrants, no tenderness to palpation, no rebound, no mass appreciated. RECTAL: No rectal masses palpated, stool hemoccult negative. MUSCULOSKELETAL: Full range of motion, normal gait, without edema. SKIN: Dry, intact without rashes or lesions. No jaundice. NEURO: Alert, oriented, no focal deficits. PSYCH: Cooperative, normal mood and affect.      Edmonia Gottron, PA-C 11:09 AM

## 2024-03-13 ENCOUNTER — Ambulatory Visit (INDEPENDENT_AMBULATORY_CARE_PROVIDER_SITE_OTHER): Payer: Self-pay | Admitting: Physician Assistant

## 2024-03-13 ENCOUNTER — Encounter: Payer: Self-pay | Admitting: Physician Assistant

## 2024-03-13 ENCOUNTER — Other Ambulatory Visit

## 2024-03-13 VITALS — BP 134/82 | HR 94 | Ht 70.0 in | Wt 325.2 lb

## 2024-03-13 DIAGNOSIS — K529 Noninfective gastroenteritis and colitis, unspecified: Secondary | ICD-10-CM

## 2024-03-13 DIAGNOSIS — K760 Fatty (change of) liver, not elsewhere classified: Secondary | ICD-10-CM

## 2024-03-13 DIAGNOSIS — K219 Gastro-esophageal reflux disease without esophagitis: Secondary | ICD-10-CM | POA: Diagnosis not present

## 2024-03-13 DIAGNOSIS — R112 Nausea with vomiting, unspecified: Secondary | ICD-10-CM | POA: Diagnosis not present

## 2024-03-13 DIAGNOSIS — R7989 Other specified abnormal findings of blood chemistry: Secondary | ICD-10-CM | POA: Diagnosis not present

## 2024-03-13 DIAGNOSIS — R1011 Right upper quadrant pain: Secondary | ICD-10-CM | POA: Diagnosis not present

## 2024-03-13 DIAGNOSIS — Z6841 Body Mass Index (BMI) 40.0 and over, adult: Secondary | ICD-10-CM

## 2024-03-13 DIAGNOSIS — Z860101 Personal history of adenomatous and serrated colon polyps: Secondary | ICD-10-CM

## 2024-03-13 LAB — CBC WITH DIFFERENTIAL/PLATELET
Basophils Absolute: 0.1 10*3/uL (ref 0.0–0.1)
Basophils Relative: 1.4 % (ref 0.0–3.0)
Eosinophils Absolute: 0.5 10*3/uL (ref 0.0–0.7)
Eosinophils Relative: 6.8 % — ABNORMAL HIGH (ref 0.0–5.0)
HCT: 43.8 % (ref 39.0–52.0)
Hemoglobin: 14.8 g/dL (ref 13.0–17.0)
Lymphocytes Relative: 29.8 % (ref 12.0–46.0)
Lymphs Abs: 2.1 10*3/uL (ref 0.7–4.0)
MCHC: 33.8 g/dL (ref 30.0–36.0)
MCV: 94.3 fl (ref 78.0–100.0)
Monocytes Absolute: 0.4 10*3/uL (ref 0.1–1.0)
Monocytes Relative: 5.5 % (ref 3.0–12.0)
Neutro Abs: 4 10*3/uL (ref 1.4–7.7)
Neutrophils Relative %: 56.5 % (ref 43.0–77.0)
Platelets: 197 10*3/uL (ref 150.0–400.0)
RBC: 4.65 Mil/uL (ref 4.22–5.81)
RDW: 15.7 % — ABNORMAL HIGH (ref 11.5–15.5)
WBC: 7.1 10*3/uL (ref 4.0–10.5)

## 2024-03-13 LAB — HEPATIC FUNCTION PANEL
ALT: 35 U/L (ref 0–53)
AST: 24 U/L (ref 0–37)
Albumin: 4.5 g/dL (ref 3.5–5.2)
Alkaline Phosphatase: 56 U/L (ref 39–117)
Bilirubin, Direct: 0.1 mg/dL (ref 0.0–0.3)
Total Bilirubin: 0.5 mg/dL (ref 0.2–1.2)
Total Protein: 7.2 g/dL (ref 6.0–8.3)

## 2024-03-13 LAB — BASIC METABOLIC PANEL WITH GFR
BUN: 11 mg/dL (ref 6–23)
CO2: 25 meq/L (ref 19–32)
Calcium: 9.5 mg/dL (ref 8.4–10.5)
Chloride: 104 meq/L (ref 96–112)
Creatinine, Ser: 1.14 mg/dL (ref 0.40–1.50)
GFR: 74.16 mL/min (ref >60.00)
Glucose, Bld: 122 mg/dL — ABNORMAL HIGH (ref 70–99)
Potassium: 3.6 meq/L (ref 3.5–5.1)
Sodium: 139 meq/L (ref 135–145)

## 2024-03-13 LAB — IBC + FERRITIN
Ferritin: 118.1 ng/mL (ref 22.0–322.0)
Iron: 80 ug/dL (ref 42–165)
Saturation Ratios: 22 % (ref 20.0–50.0)
TIBC: 364 ug/dL (ref 250.0–450.0)
Transferrin: 260 mg/dL (ref 212.0–360.0)

## 2024-03-13 MED ORDER — PLENVU 140 G PO SOLR: 1.0000 | Freq: Once | ORAL | 0 refills | Status: AC

## 2024-03-13 NOTE — Progress Notes (Signed)
 Agree with the assessment and plan as outlined by Quentin Mulling, PA-C. ? ?Keron Neenan, DO, FACG ? ?

## 2024-03-13 NOTE — Patient Instructions (Addendum)
 'Your provider has requested that you go to the basement level for lab work before leaving today. Press "B" on the elevator. The lab is located at the first door on the left as you exit the elevator.   You have been scheduled for an abdominal ultrasound at [facility] (1st floor of hospital) on ____5/22/25_______ at ____9:00 am __________. Please arrive 30 minutes prior to your appointment for registration. Make certain not to have anything to eat or drink 6 hours prior to your appointment. Should you need to reschedule your appointment, please contact radiology at [phone]. This test typically takes about 30 minutes to perform.   FIBER SUPPLEMENT You can do metamucil or fibercon once or twice a day but if this causes gas/bloating please switch to Benefiber or Citracel.  Fiber is good for constipation/diarrhea/irritable bowel syndrome.  It can also help with weight loss and can help lower your bad cholesterol (LDL).  Please do 1 TBSP in the morning in water, coffee, or tea.  It can take up to a month before you can see a difference with your bowel movements.  It is cheapest from [employer], [employer], [employer].   Metabolic dysfunction associated seatohepatitis  Now the leading cause of liver failure in the [place] .  It is normally from such risk factors as obesity, diabetes, insulin resistance, high cholesterol, or metabolic syndrome.  The only definitive therapy is weight loss and exercise.   Suggest walking 20-30 mins daily.  Decreasing carbohydrates, increasing veggies.    Fatty Liver Fatty liver is the accumulation of fat in liver cells. It is also called hepatosteatosis or steatohepatitis. It is normal for your liver to contain some fat. If fat is more than 5 to 10% of your liver's weight, you have fatty liver.  There are often no symptoms (problems) for years while damage is still occurring. People often learn about their fatty liver when they have medical tests for other  reasons. Fat can damage your liver for years or even decades without causing problems. When it becomes severe, it can cause fatigue, weight loss, weakness, and confusion. This makes you more likely to develop more serious liver problems. The liver is the largest organ in the body. It does a lot of work and often gives no warning signs when it is sick until late in a disease. The liver has many important jobs including: Breaking down foods. Storing vitamins, iron, and other minerals. Making proteins. Making bile for food digestion. Breaking down many products including medications, alcohol and some poisons.  PROGNOSIS  Fatty liver may cause no damage or it can lead to an inflammation of the liver. This is, called steatohepatitis.  Over time the liver may become scarred and hardened. This condition is called cirrhosis. Cirrhosis is serious and may lead to liver failure or cancer. NASH is one of the leading causes of cirrhosis. About 10-20% of Americans have fatty liver and a smaller 2-5% has NASH.  TREATMENT  Weight loss, fat restriction, and exercise in overweight patients produces inconsistent results but is worth trying. Good control of diabetes may reduce fatty liver. Eat a balanced, healthy diet. Increase your physical activity. There are no medical or surgical treatments for a fatty liver or NASH, but improving your diet and increasing your exercise may help prevent or reverse some of the damage. You have been scheduled for an endoscopy and colonoscopy. Please follow the written instructions given to you at your visit today.  If you use inhalers (even only as  needed), please bring them with you on the day of your procedure.  DO NOT TAKE 7 DAYS PRIOR TO TEST- Trulicity (dulaglutide) Ozempic, Wegovy (semaglutide) Mounjaro (tirzepatide) Bydureon Bcise (exanatide extended release)  DO NOT TAKE 1 DAY PRIOR TO YOUR TEST Rybelsus (semaglutide) Adlyxin (lixisenatide) Victoza  (liraglutide) Byetta (exanatide) ___________________________________________________________________________   Due to recent changes in healthcare laws, you may see the results of your imaging and laboratory studies on MyChart before your provider has had a chance to review them.  We understand that in some cases there may be results that are confusing or concerning to you. Not all laboratory results come back in the same time frame and the provider may be waiting for multiple results in order to interpret others.  Please give us  48 hours in order for your provider to thoroughly review all the results before contacting the office for clarification of your results.    I appreciate the  opportunity to care for you  Thank You   [facility]

## 2024-03-15 ENCOUNTER — Telehealth: Payer: Self-pay | Admitting: Physician Assistant

## 2024-03-15 NOTE — Telephone Encounter (Signed)
 [employer] stool negative for infection, sent message.

## 2024-03-16 LAB — HEPATITIS A ANTIBODY, TOTAL: Hepatitis A AB,Total: REACTIVE — AB

## 2024-03-16 LAB — MITOCHONDRIAL ANTIBODIES: Mitochondrial M2 Ab, IgG: <=20 U (ref <=20.0)

## 2024-03-16 LAB — ANA: Anti Nuclear Antibody (ANA): NEGATIVE

## 2024-03-16 LAB — ANTI-SMOOTH MUSCLE ANTIBODY, IGG: Actin (Smooth Muscle) Antibody (IGG): <20 U (ref <20)

## 2024-03-16 LAB — TISSUE TRANSGLUTAMINASE, IGA: (tTG) Ab, IgA: <1 U/mL

## 2024-03-16 LAB — IGG: IgG (Immunoglobin G), Serum: 788 mg/dL (ref 600–1640)

## 2024-03-16 LAB — HEPATITIS B SURFACE ANTIBODY,QUALITATIVE: Hep B S Ab: NONREACTIVE

## 2024-03-16 LAB — HEPATITIS C ANTIBODY: Hepatitis C Ab: NONREACTIVE

## 2024-03-16 LAB — IGA: Immunoglobulin A: 232 mg/dL (ref 47–310)

## 2024-03-16 LAB — HEPATITIS B SURFACE ANTIGEN: Hepatitis B Surface Ag: NONREACTIVE

## 2024-03-18 ENCOUNTER — Ambulatory Visit: Payer: Self-pay | Admitting: Physician Assistant

## 2024-03-21 ENCOUNTER — Ambulatory Visit (HOSPITAL_COMMUNITY): Admission: RE | Admit: 2024-03-21 | Source: Ambulatory Visit

## 2024-03-27 ENCOUNTER — Encounter: Payer: Self-pay | Admitting: Physician Assistant

## 2024-03-28 ENCOUNTER — Ambulatory Visit (HOSPITAL_COMMUNITY)
Admission: RE | Admit: 2024-03-28 | Discharge: 2024-03-28 | Disposition: A | Discharge status: Home / Self Care | Source: Ambulatory Visit | Attending: Physician Assistant | Admitting: Physician Assistant

## 2024-03-28 DIAGNOSIS — K760 Fatty (change of) liver, not elsewhere classified: Secondary | ICD-10-CM | POA: Diagnosis present

## 2024-03-28 DIAGNOSIS — R1011 Right upper quadrant pain: Secondary | ICD-10-CM | POA: Diagnosis present

## 2024-03-28 DIAGNOSIS — R7989 Other specified abnormal findings of blood chemistry: Secondary | ICD-10-CM | POA: Diagnosis present

## 2024-04-22 ENCOUNTER — Ambulatory Visit: Admitting: Gastroenterology

## 2024-04-22 ENCOUNTER — Encounter: Payer: Self-pay | Admitting: Gastroenterology

## 2024-04-22 VITALS — BP 141/111 | HR 82 | Temp 98.4°F | Resp 12 | Ht 70.0 in | Wt 325.0 lb

## 2024-04-22 DIAGNOSIS — K562 Volvulus: Secondary | ICD-10-CM | POA: Diagnosis not present

## 2024-04-22 DIAGNOSIS — K529 Noninfective gastroenteritis and colitis, unspecified: Secondary | ICD-10-CM

## 2024-04-22 DIAGNOSIS — K5730 Diverticulosis of large intestine without perforation or abscess without bleeding: Secondary | ICD-10-CM

## 2024-04-22 DIAGNOSIS — R1011 Right upper quadrant pain: Secondary | ICD-10-CM

## 2024-04-22 DIAGNOSIS — D125 Benign neoplasm of sigmoid colon: Secondary | ICD-10-CM

## 2024-04-22 DIAGNOSIS — K219 Gastro-esophageal reflux disease without esophagitis: Secondary | ICD-10-CM

## 2024-04-22 DIAGNOSIS — K2970 Gastritis, unspecified, without bleeding: Secondary | ICD-10-CM | POA: Diagnosis not present

## 2024-04-22 DIAGNOSIS — K2289 Other specified disease of esophagus: Secondary | ICD-10-CM | POA: Diagnosis not present

## 2024-04-22 DIAGNOSIS — K640 First degree hemorrhoids: Secondary | ICD-10-CM

## 2024-04-22 DIAGNOSIS — K648 Other hemorrhoids: Secondary | ICD-10-CM

## 2024-04-22 DIAGNOSIS — K635 Polyp of colon: Secondary | ICD-10-CM

## 2024-04-22 DIAGNOSIS — Z860101 Personal history of adenomatous and serrated colon polyps: Secondary | ICD-10-CM | POA: Diagnosis not present

## 2024-04-22 MED ORDER — SODIUM CHLORIDE 0.9 % IV SOLN: 500.0000 mL | Freq: Once | INTRAVENOUS | Status: DC

## 2024-04-22 NOTE — Op Note (Signed)
 [facility] Patient Name: Kent Roberts Procedure Date: 04/22/2024 3:16 PM MRN: [id number] Endoscopist: Sandor Flatter , MD, [id number] Age: 53 Referring MD:  Date of Birth: 1970/12/09 Gender: Male Account #: [id number] Procedure:                Upper GI endoscopy Indications:              Abdominal pain in the right upper quadrant,                            Esophageal reflux, Diarrhea Medicines:                Monitored Anesthesia Care Procedure:                Pre-Anesthesia Assessment:                           - Prior to the procedure, a History and Physical                            was performed, and patient medications and                            allergies were reviewed. The patient's tolerance of                            previous anesthesia was also reviewed. The risks                            and benefits of the procedure and the sedation                            options and risks were discussed with the patient.                            All questions were answered, and informed consent                            was obtained. Prior Anticoagulants: The patient has                            taken no anticoagulant or antiplatelet agents. ASA                            Grade Assessment: III - A patient with severe                            systemic disease. After reviewing the risks and                            benefits, the patient was deemed in satisfactory                            condition to undergo the procedure.  After obtaining informed consent, the endoscope was                            passed under direct vision. Throughout the                            procedure, the patient's blood pressure, pulse, and                            oxygen saturations were monitored continuously. The                            Endoscope was introduced through the mouth, and                            advanced to the second part of  duodenum. The upper                            GI endoscopy was accomplished without difficulty.                            The patient tolerated the procedure well. Scope In: Scope Out: Findings:                 The upper third of the esophagus and middle third                            of the esophagus were normal.                           The Z-line was irregular and was found 43 cm from                            the incisors. There were a few small,                            salmon-colored islands located immediately proximal                            to the Z line. Biopsies were taken with a cold                            forceps for histology. Estimated blood loss was                            minimal.                           Diffuse mild inflammation characterized by                            congestion (edema) and erythema was found in the  gastric body and in the gastric antrum. Biopsies                            were taken with a cold forceps for Helicobacter                            pylori testing. Estimated blood loss was minimal.                           The examined duodenum was normal. Biopsies were                            taken with a cold forceps for histology. Estimated                            blood loss was minimal. Complications:            No immediate complications. Estimated Blood Loss:     Estimated blood loss was minimal. Impression:               - Normal upper third of esophagus and middle third                            of esophagus.                           - Z-line irregular, 43 cm from the incisors.                            Biopsied.                           - Mild, non-ulcer gastritis. Biopsied.                           - Normal examined duodenum. Biopsied. Recommendation:           - Patient has a contact number available for                            emergencies. The signs and symptoms of potential                             delayed complications were discussed with the                            patient. Return to normal activities tomorrow.                            Written discharge instructions were provided to the                            patient.                           - Resume previous diet.                           -  Continue present medications.                           - Await pathology results. Sandor Flatter, MD 04/22/2024 4:07:05 PM

## 2024-04-22 NOTE — Patient Instructions (Signed)
 Upper endoscopy Resume previous diet Continue present medications Await pathology results   Colonoscopy Repeat colonoscopy for surveillance based on pathology results    YOU HAD AN ENDOSCOPIC PROCEDURE TODAY AT THE [facility]:   Refer to the procedure report that was given to you for any specific questions about what was found during the examination.  If the procedure report does not answer your questions, please call your gastroenterologist to clarify.  If you requested that your care partner not be given the details of your procedure findings, then the procedure report has been included in a sealed envelope for you to review at your convenience later.  YOU SHOULD EXPECT: Some feelings of bloating in the abdomen. Passage of more gas than usual.  Walking can help get rid of the air that was put into your GI tract during the procedure and reduce the bloating. If you had a lower endoscopy (such as a colonoscopy or flexible sigmoidoscopy) you may notice spotting of blood in your stool or on the toilet paper. If you underwent a bowel prep for your procedure, you may not have a normal bowel movement for a few days.  Please Note:  You might notice some irritation and congestion in your nose or some drainage.  This is from the oxygen used during your procedure.  There is no need for concern and it should clear up in a day or so.  SYMPTOMS TO REPORT IMMEDIATELY:  Following lower endoscopy (colonoscopy or flexible sigmoidoscopy):  Excessive amounts of blood in the stool  Significant tenderness or worsening of abdominal pains  Swelling of the abdomen that is new, acute  Fever of 100F or higher  Following upper endoscopy (EGD)  Vomiting of blood or coffee ground material  New chest pain or pain under the shoulder blades  Painful or persistently difficult swallowing  New shortness of breath  Fever of 100F or higher  Black, tarry-looking stools  For urgent or emergent issues, a  gastroenterologist can be reached at any hour by calling [phone]. Do not use MyChart messaging for urgent concerns.    DIET:  We do recommend a small meal at first, but then you may proceed to your regular diet.  Drink plenty of fluids but you should avoid alcoholic beverages for 24 hours.  ACTIVITY:  You should plan to take it easy for the rest of today and you should NOT DRIVE or use heavy machinery until tomorrow (because of the sedation medicines used during the test).    FOLLOW UP: Our staff will call the number listed on your records the next business day following your procedure.  We will call around 7:15- 8:00 am to check on you and address any questions or concerns that you may have regarding the information given to you following your procedure. If we do not reach you, we will leave a message.     If any biopsies were taken you will be contacted by phone or by letter within the next 1-3 weeks.  Please call us  at [phone] if you have not heard about the biopsies in 3 weeks.    SIGNATURES/CONFIDENTIALITY: You and/or your care partner have signed paperwork which will be entered into your electronic medical record.  These signatures attest to the fact that that the information above on your After Visit Summary has been reviewed and is understood.  Full responsibility of the confidentiality of this discharge information lies with you and/or your care-partner.

## 2024-04-22 NOTE — Progress Notes (Signed)
 To pacu, VSS. Report to RN.tb

## 2024-04-22 NOTE — Op Note (Signed)
 [facility] Patient Name: Kent Roberts Procedure Date: 04/22/2024 3:16 PM MRN: [id number] Endoscopist: Sandor Flatter , MD, [id number] Age: 53 Referring MD:  Date of Birth: May 12, 1971 Gender: Male Account #: [id number] Procedure:                Colonoscopy Indications:              Change in bowel habits, Diarrhea                           Additionally, history of adenomatous polyps on last                            colonoscopy.                           Recent RUQ US  w/ hepatic steatosis. Normal LAEs,                            BMP, CBC, Celiac panel, iron panel, and negative                            ANA, ASMA, AMA. Diatherix panel negative. Medicines:                Monitored Anesthesia Care Procedure:                Pre-Anesthesia Assessment:                           - Prior to the procedure, a History and Physical                            was performed, and patient medications and                            allergies were reviewed. The patient's tolerance of                            previous anesthesia was also reviewed. The risks                            and benefits of the procedure and the sedation                            options and risks were discussed with the patient.                            All questions were answered, and informed consent                            was obtained. Prior Anticoagulants: The patient has                            taken no anticoagulant or antiplatelet agents. ASA  Grade Assessment: III - A patient with severe                            systemic disease. After reviewing the risks and                            benefits, the patient was deemed in satisfactory                            condition to undergo the procedure.                           After obtaining informed consent, the colonoscope                            was passed under direct vision. Throughout the                             procedure, the patient's blood pressure, pulse, and                            oxygen saturations were monitored continuously. The                            CF [id number] #[id number] was introduced through the anus                            and advanced to the the cecum, identified by                            appendiceal orifice and ileocecal valve. The                            colonoscopy was technically difficult and complex                            due to significant looping. Successful completion                            of the procedure was aided by straightening and                            shortening the scope to obtain bowel loop reduction                            and applying abdominal pressure. The patient                            tolerated the procedure well. The quality of the                            bowel preparation was good. The ileocecal valve,  appendiceal orifice, and rectum were photographed. Scope In: 3:35:35 PM Scope Out: 4:01:35 PM Scope Withdrawal Time: 0 hours 14 minutes 19 seconds  Total Procedure Duration: 0 hours 26 minutes 0 seconds  Findings:                 The perianal and digital rectal examinations were                            normal.                           Four sessile polyps were found in the distal                            sigmoid colon. The polyps were 2 to 4 mm in size.                            These polyps were removed with a cold snare.                            Resection and retrieval were complete. Estimated                            blood loss was minimal.                           Multiple large-mouthed and small-mouthed                            diverticula were found in the sigmoid colon,                            descending colon, transverse colon and ascending                            colon.                           The sigmoid colon and ascending colon revealed                             significantly excessive looping. Advancing the                            scope required using water immersion, manual                            pressure, and straightening and shortening the                            scope to obtain bowel loop reduction.                           Non-bleeding internal hemorrhoids were found during  retroflexion. The hemorrhoids were small.                           The mucosa was otherwise normal appearing                            throughout the colon. Biopsies for histology were                            taken with a cold forceps from the right colon and                            left colon for evaluation of microscopic colitis.                            Estimated blood loss was minimal. Complications:            No immediate complications. Estimated Blood Loss:     Estimated blood loss was minimal. Impression:               - Four 2 to 4 mm polyps in the distal sigmoid                            colon, removed with a cold snare. Resected and                            retrieved.                           - Diverticulosis in the sigmoid colon, in the                            descending colon, in the transverse colon and in                            the ascending colon.                           - There was significant looping of the colon.                           - Non-bleeding internal hemorrhoids.                           - Normal mucosa in the entire examined colon.                            Biopsied. Recommendation:           - Patient has a contact number available for                            emergencies. The signs and symptoms of potential                            delayed complications were discussed with  the                            patient. Return to normal activities tomorrow.                            Written discharge instructions were provided to the                            patient.                            - Resume previous diet.                           - Continue present medications.                           - Await pathology results.                           - Repeat colonoscopy for surveillance based on                            pathology results. Sandor Flatter, MD 04/22/2024 4:12:52 PM

## 2024-04-22 NOTE — Progress Notes (Signed)
 GASTROENTEROLOGY PROCEDURE H&P NOTE   Primary Care Physician: Volanda Drape, MD    Reason for Procedure:   Chronic diarrhea, GERD, RUQ pain, hx of colon polyps, diverticulosis  Plan:    EGD, colonsocopy  Patient is appropriate for endoscopic procedure(s) in the ambulatory ([facility]) setting.  The nature of the procedure, as well as the risks, benefits, and alternatives were carefully and thoroughly reviewed with the patient. Ample time for discussion and questions allowed. The patient understood, was satisfied, and agreed to proceed.     HPI: Kent Roberts is a 53 y.o. male who presents for EGD and colonoscopy for evaluation of RUQ pain, GERD, chronic diarrhea, and hx of colon polyps.   Recent RUQ US  w/ hepatic steatosis. Normal LAEs, BMP, CBC, Celiac panel, iron panel, and negative ANA, ASMA, AMA. Diatherix panel negative.   Past Medical History:  Diagnosis Date   Anxiety    Arthritis    Depression    GERD (gastroesophageal reflux disease)    Hyperlipidemia    Hypertension    Lymphoma (HCC)    Migraine    Sleep apnea    uses CPAP    Past Surgical History:  Procedure Laterality Date   COLONOSCOPY     UPPER GASTROINTESTINAL ENDOSCOPY      Prior to Admission medications   Medication Sig Start Date End Date Taking? Authorizing Provider  amLODipine (NORVASC) 10 MG tablet Take 1 tablet by mouth daily. 10/08/20  Yes [provider]  celecoxib (CELEBREX) 100 MG capsule Take 1 capsule by mouth daily. 10/08/20  Yes [provider]  [employer] GLUCOSAMINE-CHONDROITIN PO Take 1 capsule by mouth daily. 08/14/17  Yes [provider]  cyclobenzaprine (FLEXERIL) 10 MG tablet Take 10 mg by mouth 2 (two) times daily as needed for muscle spasms. 11/08/22  Yes [provider]  DULoxetine (CYMBALTA) 20 MG capsule Take 60 mg by mouth daily. 02/06/21  Yes [provider]  gabapentin (NEURONTIN) 300 MG capsule Take 1 capsule by mouth at bedtime. 10/08/20  Yes  [provider]  metFORMIN (GLUCOPHAGE) 500 MG tablet Take 500 mg by mouth 2 (two) times daily. 09/15/23  Yes [provider]  Multiple Vitamin (MULTIVITAMIN ADULT PO) Take 1 tablet by mouth daily. 10/08/20  Yes [provider]  Omega-3 Fatty Acids (FISH OIL) 1000 MG CAPS Take 1 capsule by mouth 2 (two) times daily. 10/08/20  Yes [provider]  omeprazole (PRILOSEC) 20 MG capsule Take 20 mg by mouth daily. 10/08/20  Yes [provider]  rosuvastatin (CRESTOR) 20 MG tablet Take 10 mg by mouth daily. 01/22/24  Yes [provider]  testosterone cypionate (DEPOTESTOSTERONE CYPIONATE) 200 MG/ML injection Inject 200 mg into the muscle See admin instructions. Every 10 days 10/08/20  Yes [provider]  traZODone (DESYREL) 100 MG tablet Take 100 mg by mouth at bedtime as needed for sleep. 11/15/23  Yes [provider]  SUMAtriptan (IMITREX) 50 MG tablet Take 50 mg by mouth daily as needed. No more than 200mg  per day 10/08/20   [provider]    Current Outpatient Medications  Medication Sig Dispense Refill   amLODipine (NORVASC) 10 MG tablet Take 1 tablet by mouth daily.     celecoxib (CELEBREX) 100 MG capsule Take 1 capsule by mouth daily.     CVS GLUCOSAMINE-CHONDROITIN PO Take 1 capsule by mouth daily.     cyclobenzaprine (FLEXERIL) 10 MG tablet Take 10 mg by mouth 2 (two) times daily as needed for muscle  spasms.     DULoxetine (CYMBALTA) 20 MG capsule Take 60 mg by mouth daily.     gabapentin (NEURONTIN) 300 MG capsule Take 1 capsule by mouth at bedtime.     metFORMIN (GLUCOPHAGE) 500 MG tablet Take 500 mg by mouth 2 (two) times daily.     Multiple Vitamin (MULTIVITAMIN ADULT PO) Take 1 tablet by mouth daily.     Omega-3 Fatty Acids (FISH OIL) 1000 MG CAPS Take 1 capsule by mouth 2 (two) times daily.     omeprazole (PRILOSEC) 20 MG capsule Take 20 mg by mouth daily.     rosuvastatin (CRESTOR) 20 MG tablet Take 10 mg by  mouth daily.     testosterone cypionate (DEPOTESTOSTERONE CYPIONATE) 200 MG/ML injection Inject 200 mg into the muscle See admin instructions. Every 10 days     traZODone (DESYREL) 100 MG tablet Take 100 mg by mouth at bedtime as needed for sleep.     SUMAtriptan (IMITREX) 50 MG tablet Take 50 mg by mouth daily as needed. No more than 200mg  per day     Current Facility-Administered Medications  Medication Dose Route Frequency Provider Last Rate Last Admin   0.9 %  sodium chloride  infusion  500 mL Intravenous Once Mildred Bollard V, DO        Allergies as of 04/22/2024 - Review Complete 04/22/2024  Allergen Reaction Noted   Bee venom Swelling 08/04/2021    Family History  Problem Relation Age of Onset   Pancreatic cancer Mother    High blood pressure Father    Neuropathy Father    Colon cancer Paternal Grandfather        not 100% sure of this, he did have lung cancer   Esophageal cancer Neg Hx    Stomach cancer Neg Hx    Rectal cancer Neg Hx     Social History   Socioeconomic History   Marital status: Single    Spouse name: Not on file   Number of children: Not on file   Years of education: Not on file   Highest education level: Bachelor's degree (e.g., BA, AB, BS)  Occupational History   Not on file  Tobacco Use   Smoking status: Some Days    Types: Cigarettes, Cigars   Smokeless tobacco: Never  Vaping Use   Vaping status: Never Used  Substance and Sexual Activity   Alcohol use: Yes    Comment: occasionally    Drug use: Never   Sexual activity: Not Currently  Other Topics Concern   Not on file  Social History Narrative   Right handed   Caffeine 3-4 cups per day   Lives at home ex-girlfriend    Social Drivers of Corporate investment banker Strain: Not on file  Food Insecurity: Not on file  Transportation Needs: Not on file  Physical Activity: Not on file  Stress: Not on file  Social Connections: Not on file  Intimate Partner Violence: Not At Risk  (09/15/2023)   Received from [facility]   HITS    Over the last 12 months how often did your partner physically hurt you?: Never    Over the last 12 months how often did your partner insult you or talk down to you?: Never    Over the last 12 months how often did your partner threaten you with physical harm?: Never    Over the last 12 months how often did your partner scream or curse at you?: Never    Physical  Exam: Vital signs in last 24 hours: @BP  133/70   Pulse 92   Temp 98.4 F (36.9 C) (Temporal)   Ht 5' 10 (1.778 m)   Wt (!) 325 lb (147.4 kg)   SpO2 95%   BMI 46.63 kg/m  GEN: NAD EYE: Sclerae anicteric ENT: MMM CV: Non-tachycardic Pulm: CTA b/l GI: Soft, NT/ND NEURO:  Alert & Oriented x 3   Sandor Flatter, DO [facility]   04/22/2024 3:13 PM

## 2024-04-23 ENCOUNTER — Telehealth: Payer: Self-pay | Admitting: *Deleted

## 2024-04-23 NOTE — Telephone Encounter (Signed)
 Left message on f/u call

## 2024-04-25 LAB — SURGICAL PATHOLOGY

## 2024-05-06 ENCOUNTER — Ambulatory Visit: Payer: Self-pay | Admitting: Gastroenterology

## 2024-09-05 DIAGNOSIS — G8929 Other chronic pain: Secondary | ICD-10-CM | POA: Insufficient documentation

## 2024-09-05 DIAGNOSIS — M47816 Spondylosis without myelopathy or radiculopathy, lumbar region: Secondary | ICD-10-CM | POA: Insufficient documentation

## 2024-09-05 DIAGNOSIS — M542 Cervicalgia: Secondary | ICD-10-CM | POA: Insufficient documentation

## 2024-09-05 DIAGNOSIS — M533 Sacrococcygeal disorders, not elsewhere classified: Secondary | ICD-10-CM | POA: Insufficient documentation

## 2024-09-05 DIAGNOSIS — M5430 Sciatica, unspecified side: Secondary | ICD-10-CM | POA: Insufficient documentation

## 2024-09-05 DIAGNOSIS — M5441 Lumbago with sciatica, right side: Secondary | ICD-10-CM | POA: Insufficient documentation

## 2024-09-05 DIAGNOSIS — M7918 Myalgia, other site: Secondary | ICD-10-CM | POA: Insufficient documentation

## 2024-09-09 ENCOUNTER — Ambulatory Visit (INDEPENDENT_AMBULATORY_CARE_PROVIDER_SITE_OTHER): Admitting: Neurology

## 2024-09-09 ENCOUNTER — Encounter: Payer: Self-pay | Admitting: Neurology

## 2024-09-09 VITALS — BP 137/84 | HR 97 | Ht 70.0 in | Wt 318.0 lb

## 2024-09-09 DIAGNOSIS — G43709 Chronic migraine without aura, not intractable, without status migrainosus: Secondary | ICD-10-CM | POA: Insufficient documentation

## 2024-09-09 MED ORDER — PROPRANOLOL HCL ER 60 MG PO CP24: 60.0000 mg | ORAL_CAPSULE | Freq: Every day | ORAL | 11 refills | Status: AC

## 2024-09-09 MED ORDER — RIZATRIPTAN BENZOATE 10 MG PO TBDP: 10.0000 mg | ORAL_TABLET | ORAL | 6 refills | Status: DC | PRN

## 2024-09-09 MED ORDER — RIZATRIPTAN BENZOATE 10 MG PO TBDP
10.0000 mg | ORAL_TABLET | ORAL | 6 refills | Status: AC | PRN
Start: 2024-09-09 — End: ?

## 2024-09-09 MED ORDER — PROPRANOLOL HCL ER 60 MG PO CP24: 60.0000 mg | ORAL_CAPSULE | Freq: Every day | ORAL | 11 refills | Status: DC

## 2024-09-09 NOTE — Progress Notes (Signed)
 Chief Complaint  Patient presents with   Consult    Pt in room 14. Alone. Paper referral for Headaches.   ASSESSMENT AND PLAN  Kent Roberts is a 53 y.o. male   Chronic migraine  Also complicated by his obesity, obstructive sleep apnea, is followed up by [facility], using CPAP,  Add on propranolol LA 60 mg daily as migraine prevention  Suboptimal response to Imitrex, Maxalt as needed,  DIAGNOSTIC DATA (LABS, IMAGING, TESTING) - I reviewed patient records, labs, notes, testing and imaging myself where available.   MEDICAL HISTORY:  Kent Roberts, is a 53 year old male, seen in request by his primary care from [facility] Dr. Volanda, Dhanashree to follow-up on migraine headache, she was seen by Dr. Rush in Nov 2022 for similar complaints    History is obtained from the patient and review of electronic medical records. I personally reviewed pertinent available imaging films in PACS.   PMHx of  Obesity DM HLD Testosterone  HTN OSA-using CPAP Depression, anxiety  He began to experience migraine around 2012, typical migraine are retro-orbital area sharp severe pain, with light noise sensitivity, movement made it worse, for a while he was having migraine headaches 3-4 times each week, lasting for few hours a whole day,  Previously tried Imitrex 50 mg as needed, was not sure it is very helpful, has run out of the prescription, he has depression anxiety, taking Cymbalta, trazodone, seems to help his symptoms  Previously had MRI of the brain with without contrast in January 2023, no acute abnormality,  CT angiogram head and neck October 2022, no large vessel disease   PHYSICAL EXAM:   Vitals:   09/09/24 0854  BP: 137/84  Pulse: 97  Weight: (!) 318 lb (144.2 kg)  Height: 5' 10 (1.778 m)     Body mass index is 45.63 kg/m.  PHYSICAL EXAMNIATION:  Gen: NAD, conversant, well nourised, well groomed                     Cardiovascular: Regular rate rhythm, no peripheral  edema, warm, nontender. Eyes: Conjunctivae clear without exudates or hemorrhage Neck: Supple, no carotid bruits. Pulmonary: Clear to auscultation bilaterally   NEUROLOGICAL EXAM:  MENTAL STATUS: Speech/cognition: Obese: Awake, alert, oriented to history taking and casual conversation CRANIAL NERVES: CN II: Visual fields are full to confrontation. Pupils are round equal and briskly reactive to light. CN III, IV, VI: extraocular movement are normal. No ptosis. CN V: Facial sensation is intact to light touch CN VII: Face is symmetric with normal eye closure  CN VIII: Hearing is normal to causal conversation. CN IX, X: Phonation is normal. CN XI: Head turning and shoulder shrug are intact  MOTOR: There is no pronator drift of out-stretched arms. Muscle bulk and tone are normal. Muscle strength is normal.  REFLEXES: Reflexes are 1 and symmetric at the biceps, triceps, knees, and ankles. Plantar responses are flexor.  SENSORY: Intact to light touch, pinprick and vibratory sensation are intact in fingers and toes.  COORDINATION: There is no trunk or limb dysmetria noted.  GAIT/STANCE: Push-up, antalgic  REVIEW OF SYSTEMS:  Full 14 system review of systems performed and notable only for as above All other review of systems were negative.   ALLERGIES: Allergies  Allergen Reactions   Bee Venom Swelling    Denies anaphylaxis     HOME MEDICATIONS: Current Outpatient Medications  Medication Sig Dispense Refill   amLODipine (NORVASC) 10 MG tablet Take 1 tablet by  mouth daily.     celecoxib (CELEBREX) 100 MG capsule Take 1 capsule by mouth daily.     CVS GLUCOSAMINE-CHONDROITIN PO Take 1 capsule by mouth daily.     cyclobenzaprine (FLEXERIL) 10 MG tablet Take 10 mg by mouth 2 (two) times daily as needed for muscle spasms.     DULoxetine (CYMBALTA) 20 MG capsule Take 60 mg by mouth daily.     gabapentin (NEURONTIN) 300 MG capsule Take 1 capsule by mouth at bedtime.     Multiple  Vitamin (MULTIVITAMIN ADULT PO) Take 1 tablet by mouth daily.     Omega-3 Fatty Acids (FISH OIL) 1000 MG CAPS Take 1 capsule by mouth 2 (two) times daily.     omeprazole (PRILOSEC) 20 MG capsule Take 20 mg by mouth daily.     rosuvastatin (CRESTOR) 20 MG tablet Take 10 mg by mouth daily.     SUMAtriptan (IMITREX) 50 MG tablet Take 50 mg by mouth daily as needed. No more than 200mg  per day     testosterone cypionate (DEPOTESTOSTERONE CYPIONATE) 200 MG/ML injection Inject 200 mg into the muscle See admin instructions. Every 10 days     tirzepatide (ZEPBOUND) 5 MG/0.5ML injection vial Inject 0.5 mg into the skin once a week.     traZODone (DESYREL) 100 MG tablet Take 100 mg by mouth at bedtime as needed for sleep.     No current facility-administered medications for this visit.    PAST MEDICAL HISTORY: Past Medical History:  Diagnosis Date   Anxiety    Arthritis    Depression    GERD (gastroesophageal reflux disease)    Hyperlipidemia    Hypertension    Lymphoma (HCC)    Migraine    Sleep apnea    uses CPAP    PAST SURGICAL HISTORY: Past Surgical History:  Procedure Laterality Date   COLONOSCOPY     UPPER GASTROINTESTINAL ENDOSCOPY      FAMILY HISTORY: Family History  Problem Relation Age of Onset   Pancreatic cancer Mother    High blood pressure Father    Neuropathy Father    Colon cancer Paternal Grandfather        not 100% sure of this, he did have lung cancer   Esophageal cancer Neg Hx    Stomach cancer Neg Hx    Rectal cancer Neg Hx     SOCIAL HISTORY: Social History   Socioeconomic History   Marital status: Single    Spouse name: Not on file   Number of children: Not on file   Years of education: Not on file   Highest education level: Bachelor's degree (e.g., BA, AB, BS)  Occupational History   Not on file  Tobacco Use   Smoking status: Some Days    Types: Cigarettes, Cigars   Smokeless tobacco: Never  Vaping Use   Vaping status: Never Used   Substance and Sexual Activity   Alcohol use: Yes    Comment: occasionally    Drug use: Never   Sexual activity: Not Currently  Other Topics Concern   Not on file  Social History Narrative   Right handed   Caffeine 3-4 cups per day   Lives at home ex-girlfriend    Social Drivers of Health   Financial Resource Strain: Not on file  Food Insecurity: Not on file  Transportation Needs: Not on file  Physical Activity: Not on file  Stress: Not on file  Social Connections: Not on file  Intimate Partner Violence: Not At  Risk (09/15/2023)   Received from [facility]   HITS    Over the last 12 months how often did your partner physically hurt you?: Never    Over the last 12 months how often did your partner insult you or talk down to you?: Never    Over the last 12 months how often did your partner threaten you with physical harm?: Never    Over the last 12 months how often did your partner scream or curse at you?: Never      Modena Callander, M.D. Ph.D.  [facility] [address], [address] [place], [place] [address] Ph: [phone] Fax: [phone]  CC:  Volanda Saupe, MD [address] [place],  [place] [address]  Volanda Drape, MD

## 2025-03-20 ENCOUNTER — Ambulatory Visit: Admitting: Adult Health
